# Patient Record
Sex: Male | Born: 1990 | Race: White | Hispanic: No | Marital: Single
Health system: Midwestern US, Community
[De-identification: ages and names within clinical notes are randomized; demographics above are authoritative.]

## PROBLEM LIST (undated history)

## (undated) DIAGNOSIS — B20 Human immunodeficiency virus [HIV] disease: Secondary | ICD-10-CM

## (undated) DIAGNOSIS — I10 Essential (primary) hypertension: Secondary | ICD-10-CM

## (undated) HISTORY — PX: FRACTURE SURGERY: SHX138

## (undated) HISTORY — PX: TONSILLECTOMY: SUR1361

---

## 2014-07-07 NOTE — ED Notes (Signed)
Pt states "I am suppose to get some cysts removed on the back of my head, and they are painful."

## 2014-07-08 NOTE — ED Provider Notes (Signed)
HPI Comments: 12:18 AM           Patient is a 23 y.o. male presenting with skin problem. The history is provided by the patient. No language interpreter was used.   Skin Problem        Written by Ephriam Jenkins, ED Scribe, as dictated by Osborne Oman, PA-C.     Past Medical History   Diagnosis Date   ??? Musculoskeletal disorder    ??? Compound fracture    ??? Ill-defined condition         Past Surgical History   Procedure Laterality Date   ??? Hx tonsillectomy     ??? Hx orthopaedic           History reviewed. No pertinent family history.     History     Social History   ??? Marital Status: SINGLE     Spouse Name: N/A     Number of Children: N/A   ??? Years of Education: N/A     Occupational History   ??? Not on file.     Social History Main Topics   ??? Smoking status: Never Smoker    ??? Smokeless tobacco: Not on file   ??? Alcohol Use: No   ??? Drug Use: No   ??? Sexual Activity: Not on file     Other Topics Concern   ??? Not on file     Social History Narrative   ??? No narrative on file                  ALLERGIES: Pcn      Review of Systems   Constitutional: Negative for fever and fatigue.   HENT: Negative for rhinorrhea and sore throat.    Respiratory: Negative for cough and shortness of breath.    Cardiovascular: Negative for chest pain and palpitations.   Gastrointestinal: Negative for nausea, vomiting, abdominal pain and diarrhea.   Genitourinary: Negative for dysuria and difficulty urinating.   Musculoskeletal: Negative for myalgias and arthralgias.   Skin: Negative for color change and rash.   Neurological: Negative for light-headedness and headaches.   All other systems reviewed and are negative.      Filed Vitals:    07/07/14 2137   BP: 157/84   Pulse: 83   Temp: 97.9 ??F (36.6 ??C)   Resp: 18   Height: 5\' 10"  (1.778 m)   Weight: 89.812 kg (198 lb)   SpO2: 98%            Physical Exam   Nursing note and vitals reviewed.     RESULTS:    No orders to display        Labs Reviewed - No data to display     No results found for this or any previous visit (from the past 12 hour(s)).    MDM  Number of Diagnoses or Management Options  Patient left without being seen:   Diagnosis management comments: Left without being seen    MEDICATIONS GIVEN:  Medications - No data to display   Procedures    PROGRESS NOTE:  12:18 AM  Initial assessment performed.  Recorded by Ephriam Jenkins, ED Scribe, as dictated by Osborne Oman, PA-C.    Discharge Note:  Henry James's results have been reviewed with him and/or his family. He has been counseled regarding his diagnosis, treatment, and plan. He verbally conveys understanding and agreement of the signs, symptoms, diagnosis, treatment and prognosis and additionally agrees to follow up as discussed.  He also agrees with the care-plan and conveys that all of his questions have been answered. I have also provided discharge instructions for him that include: educational information regarding the diagnosis and treatment, and a list of reasons why He would want to return to the ED prior to his follow-up appointment, should his condition change.    CLINICAL IMPRESSION    1. Patient left without being seen        After visit plan:  D/c home    There are no discharge medications for this patient.      Follow-up Information    None          Recorded by Ephriam Jenkins, ED Scribe, as dictated by Osborne Oman, PA-C.    I agree with the above documentation as written by the scribe.  Osborne Oman, PA-C.

## 2014-09-29 ENCOUNTER — Emergency Department: Admit: 2014-09-30 | Payer: TRICARE (CHAMPUS) | Primary: Student in an Organized Health Care Education/Training Program

## 2014-09-29 ENCOUNTER — Inpatient Hospital Stay
Admit: 2014-09-29 | Discharge: 2014-09-30 | Disposition: A | Discharge status: Home / Self Care | Payer: TRICARE (CHAMPUS) | Attending: Emergency Medicine

## 2014-09-29 DIAGNOSIS — R51 Headache: Secondary | ICD-10-CM

## 2014-09-29 MED ORDER — SODIUM CHLORIDE 0.9% BOLUS IV
0.9 % | Freq: Once | INTRAVENOUS | Status: AC
Start: 2014-09-29 — End: 2014-09-29
  Administered 2014-09-30: via INTRAVENOUS

## 2014-09-29 MED ORDER — KETOROLAC TROMETHAMINE 30 MG/ML INJECTION
30 mg/mL (1 mL) | INTRAMUSCULAR | Status: AC
Start: 2014-09-29 — End: 2014-09-29
  Administered 2014-09-30: via INTRAVENOUS

## 2014-09-29 MED ORDER — PROMETHAZINE 25 MG/ML INJECTION
25 mg/mL | INTRAMUSCULAR | Status: AC
Start: 2014-09-29 — End: 2014-09-29
  Administered 2014-09-30: via INTRAVENOUS

## 2014-09-29 MED FILL — KETOROLAC TROMETHAMINE 30 MG/ML INJECTION: 30 mg/mL (1 mL) | INTRAMUSCULAR | Qty: 1

## 2014-09-29 MED FILL — SODIUM CHLORIDE 0.9 % IV: INTRAVENOUS | Qty: 1000

## 2014-09-29 MED FILL — PROMETHAZINE 25 MG/ML INJECTION: 25 mg/mL | INTRAMUSCULAR | Qty: 0.5

## 2014-09-29 NOTE — ED Notes (Signed)
"  I have been having headache but today it was worse and I got dizzy and I fell to my knee and my right knee hurts. " Patient denies LOC

## 2014-09-29 NOTE — ED Notes (Signed)
Patient armband removed and shredded I have reviewed discharge instructions with the patient.  The patient verbalized understanding.  Taken home by friend at bedside.   1 prescription and work note given.

## 2014-09-29 NOTE — ED Provider Notes (Signed)
HPI Comments:   7:40 PM  23 y.o. male presents to ED C/O constant HA onset 5 months ago. Associated symptoms include nausea. Pt was seen at [facility], had a biopsy performed on a cyst on the right side of his head, had CT scans performed, and rx'd pain medications. Pt reports the HAs have been worsening lately and was walking today, became dizzy, and suddenyl fell to the ground. Pt has not yet seen a neurologist. Pt was recently dx'd with HIV (76month ago, taking meds & has pcp following his care). Patient denies fever, vomiting, difficulty sleeping, CP, SOB, depression, or any other symptoms or complaints.     Patient is a 23y.o. male presenting with headaches. The history is provided by the patient. No language interpreter was used.   Headache   This is a recurrent problem. The current episode started more than 1 week ago. The problem has been gradually worsening. The headache is aggravated by nausea. Associated symptoms include nausea. Pertinent negatives include no fever, no palpitations, no shortness of breath and no vomiting.      Written by KStormy Card ED Scribe, as dictated by BClement Husbands PA-C     Past Medical History   Diagnosis Date   ??? Musculoskeletal disorder    ??? Compound fracture    ??? Ill-defined condition    ??? HIV (human immunodeficiency virus infection) ([facility]         Past Surgical History   Procedure Laterality Date   ??? Hx tonsillectomy     ??? Hx orthopaedic           History reviewed. No pertinent family history.     History     Social History   ??? Marital Status: SINGLE     Spouse Name: N/A     Number of Children: N/A   ??? Years of Education: N/A     Occupational History   ??? Not on file.     Social History Main Topics   ??? Smoking status: Never Smoker    ??? Smokeless tobacco: Not on file   ??? Alcohol Use: No   ??? Drug Use: No   ??? Sexual Activity: Not on file     Other Topics Concern   ??? Not on file     Social History Narrative                  ALLERGIES: Pcn       Review of Systems   Constitutional: Negative for fever and fatigue.   HENT: Negative for rhinorrhea and sore throat.    Respiratory: Negative for cough and shortness of breath.    Cardiovascular: Negative for chest pain and palpitations.   Gastrointestinal: Positive for nausea. Negative for vomiting, abdominal pain and diarrhea.   Genitourinary: Negative for dysuria and difficulty urinating.   Musculoskeletal: Negative for myalgias and arthralgias.   Skin: Negative for color change and rash.   Neurological: Positive for headaches. Negative for light-headedness.   All other systems reviewed and are negative.      Filed Vitals:    09/29/14 1830   BP: 155/91   Pulse: 84   Temp: 98 ??F (36.7 ??C)   Resp: 20   Height: '5\' 10"'  (1.778 m)   Weight: 90.719 kg (200 lb)   SpO2: 100%            Physical Exam   Constitutional: He is oriented to person, place, and time. He appears well-developed and  well-nourished.  Non-toxic appearance. He does not have a sickly appearance. He does not appear ill. No distress.   texting on phone when provider entered the room, appears comfortable & in NAD, smiling during H&P   HENT:   Head: Normocephalic and atraumatic.   Right Ear: External ear normal.   Left Ear: External ear normal.   Nose: Nose normal.   Mouth/Throat: Oropharynx is clear and moist.   Eyes: Conjunctivae and EOM are normal. Pupils are equal, round, and reactive to light.   Neck: Normal range of motion. Neck supple.   No meningeal signs   Cardiovascular: Normal rate and regular rhythm.    Pulmonary/Chest: Effort normal and breath sounds normal.   Musculoskeletal: Normal range of motion.   Neurological: He is alert and oriented to person, place, and time. No cranial nerve deficit. Coordination normal.   Skin: Skin is warm and dry.   Psychiatric: He has a normal mood and affect. His behavior is normal.   Nursing note and vitals reviewed.       RESULTS:    EKG interpretation: (Preliminary)  6:43 PM    Normal sinus rhythm with sinus arrhythmia  Normal ECG  ST elevation probably second degree  Early repolarization  Vent. Rate 78 bpm  EKG read by Edwena Bunde, MD    X-RAY FINDINGS:  8:59 PM  Right Knee X-ray shows NAP.  Pending review by Radiologist  Recorded by Salley Slaughter, ED Scribe, as dictated by Clement Husbands, PA-C     XR KNEE RT MIN 4 V   Preliminary Result      CT HEAD WO CONT   Final Result   IMPRESSION:  No acute intracranial abnormalities.        Labs Reviewed   CBC WITH AUTOMATED DIFF - Abnormal; Notable for the following:     MONOCYTES 11 (*)     All other components within normal limits   METABOLIC PANEL, COMPREHENSIVE - Abnormal; Notable for the following:     BUN/Creatinine ratio 8 (*)     All other components within normal limits       Recent Results (from the past 12 hour(s))   EKG, 12 LEAD, INITIAL    Collection Time: 09/29/14  6:43 PM   Result Value Ref Range    Ventricular Rate 78 BPM    Atrial Rate 78 BPM    P-R Interval 144 ms    QRS Duration 88 ms    Q-T Interval 350 ms    QTC Calculation (Bezet) 399 ms    Calculated P Axis 40 degrees    Calculated R Axis 18 degrees    Calculated T Axis 20 degrees    Diagnosis       Normal sinus rhythm with sinus arrhythmia  Normal ECG  No previous ECGs available     CBC WITH AUTOMATED DIFF    Collection Time: 09/29/14  7:52 PM   Result Value Ref Range    WBC 6.5 4.6 - 13.2 K/uL    RBC 5.29 4.70 - 5.50 M/uL    HGB 15.9 13.0 - 16.0 g/dL    HCT 45.2 36.0 - 48.0 %    MCV 85.4 74.0 - 97.0 FL    MCH 30.1 24.0 - 34.0 PG    MCHC 35.2 31.0 - 37.0 g/dL    RDW 13.7 11.6 - 14.5 %    PLATELET 219 135 - 420 K/uL    MPV 10.2 9.2 - 11.8 FL  NEUTROPHILS 50 40 - 73 %    LYMPHOCYTES 37 21 - 52 %    MONOCYTES 11 (H) 3 - 10 %    EOSINOPHILS 2 0 - 5 %    BASOPHILS 0 0 - 2 %    ABS. NEUTROPHILS 3.2 1.8 - 8.0 K/UL    ABS. LYMPHOCYTES 2.4 0.9 - 3.6 K/UL    ABS. MONOCYTES 0.7 0.05 - 1.2 K/UL    ABS. EOSINOPHILS 0.1 0.0 - 0.4 K/UL    ABS. BASOPHILS 0.0 0.0 - 0.06 K/UL     DF AUTOMATED     METABOLIC PANEL, COMPREHENSIVE    Collection Time: 09/29/14  7:52 PM   Result Value Ref Range    Sodium 145 136 - 145 mmol/L    Potassium 3.8 3.5 - 5.5 mmol/L    Chloride 107 100 - 108 mmol/L    CO2 29 21 - 32 mmol/L    Anion gap 9 3.0 - 18 mmol/L    Glucose 97 74 - 99 mg/dL    BUN 10 7.0 - 18 MG/DL    Creatinine 1.29 0.6 - 1.3 MG/DL    BUN/Creatinine ratio 8 (L) 12 - 20      GFR est AA >60 >60 ml/min/1.79m    GFR est non-AA >60 >60 ml/min/1.772m   Calcium 8.9 8.5 - 10.1 MG/DL    Bilirubin, total 0.6 0.2 - 1.0 MG/DL    ALT 25 16 - 61 U/L    AST 19 15 - 37 U/L    Alk. phosphatase 90 45 - 117 U/L    Protein, total 7.5 6.4 - 8.2 g/dL    Albumin 4.0 3.4 - 5.0 g/dL    Globulin 3.5 2.0 - 4.0 g/dL    A-G Ratio 1.1 0.8 - 1.7          MDM  Number of Diagnoses or Management Options  Headache, unspecified headache type:   Knee contusion, right, initial encounter:      Amount and/or Complexity of Data Reviewed  Clinical lab tests: ordered and reviewed  Tests in the radiology section of CPT??: ordered and reviewed (Right Knee X-ray and CT Head)  Tests in the medicine section of CPT??: ordered and reviewed (EKG)  Independent visualization of images, tracings, or specimens: yes (EKG and Right Knee X-ray)        MEDICATIONS GIVEN:  Medications   ketorolac (TORADOL) injection 30 mg (30 mg IntraVENous Given 09/29/14 2000)   promethazine (PHENERGAN) 12.5 mg in 0.9% sodium chloride 50 mL IVPB (0 mg IntraVENous IV Completed 09/29/14 2024)   sodium chloride 0.9 % bolus infusion 1,000 mL (1,000 mL IntraVENous New Bag 09/29/14 2000)        Procedures    PROGRESS NOTE:  7:40 PM  Initial assessment performed.  Written by KrStormy CardED Scribe, as dictated by BrClement HusbandsPA-C    DISCHARGE NOTE:  9:19 PM   Jarryn Mroz's  results have been reviewed with him.  He has been counseled regarding his diagnosis, treatment, and plan.  He verbally conveys understanding and agreement of the signs, symptoms, diagnosis,  treatment and prognosis and additionally agrees to follow up as discussed.  He also agrees with the care-plan and conveys that all of his questions have been answered.  I have also provided discharge instructions for him that include: educational information regarding their diagnosis and treatment, and list of reasons why they would want to return to the ED prior to their follow-up appointment, should  his condition change.    CLINICAL IMPRESSION:    1. Headache, unspecified headache type    2. Knee contusion, right, initial encounter        PLAN: DISCHARGE HOME    Follow-up Information     Follow up With Details Comments [place] Call in 2 days Follow up with your PCP. [phone]    [facility] EMERGENCY DEPT  As needed, If symptoms worsen [address]  [place] [address]  [phone]          Current Discharge Medication List      START taking these medications    Details   butalbital-acetaminophen-caffeine (FIORICET) 50-325-40 mg per tablet Take 1 Tab by mouth every four (4) hours as needed for Headache. Max Daily Amount: 6 Tabs.  Qty: 15 Tab, Refills: 0         CONTINUE these medications which have NOT CHANGED    Details   abacavir-dolutegravir-lamiVUDine (TRIUMEQ) tablet Take  by mouth daily.             Written by Stormy Card, ED Scribe, as dictated by Clement Husbands, PA-C.       I agree with the above documentation as written by the scribe.  Clement Husbands, PA-C

## 2014-09-29 NOTE — ED Notes (Signed)
Patient goes to xray.

## 2014-09-30 LAB — METABOLIC PANEL, COMPREHENSIVE
A-G Ratio: 1.1 (ref 0.8–1.7)
ALT (SGPT): 25 U/L (ref 16–61)
AST (SGOT): 19 U/L (ref 15–37)
Albumin: 4 g/dL (ref 3.4–5.0)
Alk. phosphatase: 90 U/L (ref 45–117)
Anion gap: 9 mmol/L (ref 3.0–18)
BUN/Creatinine ratio: 8 — ABNORMAL LOW (ref 12–20)
BUN: 10 MG/DL (ref 7.0–18)
Bilirubin, total: 0.6 MG/DL (ref 0.2–1.0)
CO2: 29 mmol/L (ref 21–32)
Calcium: 8.9 MG/DL (ref 8.5–10.1)
Chloride: 107 mmol/L (ref 100–108)
Creatinine: 1.29 MG/DL (ref 0.6–1.3)
GFR est AA: >60 mL/min/{1.73_m2} (ref >60)
GFR est non-AA: >60 mL/min/{1.73_m2} (ref >60)
Globulin: 3.5 g/dL (ref 2.0–4.0)
Glucose: 97 mg/dL (ref 74–99)
Potassium: 3.8 mmol/L (ref 3.5–5.5)
Protein, total: 7.5 g/dL (ref 6.4–8.2)
Sodium: 145 mmol/L (ref 136–145)

## 2014-09-30 LAB — CBC WITH AUTOMATED DIFF
ABS. BASOPHILS: 0 10*3/uL (ref 0.0–0.06)
ABS. EOSINOPHILS: 0.1 10*3/uL (ref 0.0–0.4)
ABS. LYMPHOCYTES: 2.4 10*3/uL (ref 0.9–3.6)
ABS. MONOCYTES: 0.7 10*3/uL (ref 0.05–1.2)
ABS. NEUTROPHILS: 3.2 10*3/uL (ref 1.8–8.0)
BASOPHILS: 0 % (ref 0–2)
EOSINOPHILS: 2 % (ref 0–5)
HCT: 45.2 % (ref 36.0–48.0)
HGB: 15.9 g/dL (ref 13.0–16.0)
LYMPHOCYTES: 37 % (ref 21–52)
MCH: 30.1 PG (ref 24.0–34.0)
MCHC: 35.2 g/dL (ref 31.0–37.0)
MCV: 85.4 FL (ref 74.0–97.0)
MONOCYTES: 11 % — ABNORMAL HIGH (ref 3–10)
MPV: 10.2 FL (ref 9.2–11.8)
NEUTROPHILS: 50 % (ref 40–73)
PLATELET: 219 10*3/uL (ref 135–420)
RBC: 5.29 M/uL (ref 4.70–5.50)
RDW: 13.7 % (ref 11.6–14.5)
WBC: 6.5 10*3/uL (ref 4.6–13.2)

## 2014-09-30 MED ORDER — BUTALBITAL-ACETAMINOPHEN-CAFFEINE 50 MG-325 MG-40 MG TAB
50-325-40 mg | ORAL_TABLET | ORAL | Status: DC | PRN
Start: 2014-09-30 — End: 2014-11-16

## 2014-10-01 LAB — EKG, 12 LEAD, INITIAL
Atrial Rate: 78 {beats}/min
Calculated P Axis: 40 degrees
Calculated R Axis: 18 degrees
Calculated T Axis: 20 degrees
Diagnosis: NORMAL
P-R Interval: 144 ms
Q-T Interval: 350 ms
QRS Duration: 88 ms
QTC Calculation (Bezet): 399 ms
Ventricular Rate: 78 {beats}/min

## 2014-11-16 ENCOUNTER — Inpatient Hospital Stay
Admit: 2014-11-16 | Discharge: 2014-11-16 | Disposition: A | Discharge status: Home / Self Care | Payer: TRICARE (CHAMPUS) | Attending: Emergency Medicine

## 2014-11-16 ENCOUNTER — Emergency Department: Admit: 2014-11-16 | Payer: TRICARE (CHAMPUS) | Primary: Student in an Organized Health Care Education/Training Program

## 2014-11-16 DIAGNOSIS — J209 Acute bronchitis, unspecified: Secondary | ICD-10-CM

## 2014-11-16 LAB — INFLUENZA A & B AG (RAPID TEST)
Influenza A Antigen: NEGATIVE
Influenza B Antigen: NEGATIVE

## 2014-11-16 MED ORDER — AZITHROMYCIN 250 MG TAB
250 mg | ORAL | Status: AC
Start: 2014-11-16 — End: 2014-11-21

## 2014-11-16 NOTE — ED Notes (Signed)
Pt discharged home stable and ambulatory.   Pain level at discharge unchanged.  Pt discharged with self.  Reviewed discharged instructions with patient who verbalized understanding.  Patient armband removed and shredded

## 2014-11-16 NOTE — ED Notes (Signed)
Pt reports first time he has been sick since discovering he is HIV positive. Pt reports Wednesday he began to have a sore throat and then began to experience nasal congestion and body aches.
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
## 2015-08-13 ENCOUNTER — Emergency Department (HOSPITAL_COMMUNITY)
Admission: EM | Admit: 2015-08-13 | Discharge: 2015-08-13 | Disposition: A | Discharge status: Home / Self Care | Payer: No Typology Code available for payment source | Attending: Emergency Medicine | Admitting: Emergency Medicine

## 2015-08-13 ENCOUNTER — Encounter (HOSPITAL_COMMUNITY): Payer: Self-pay | Admitting: Emergency Medicine

## 2015-08-13 ENCOUNTER — Emergency Department (HOSPITAL_COMMUNITY): Payer: No Typology Code available for payment source

## 2015-08-13 DIAGNOSIS — Z79899 Other long term (current) drug therapy: Secondary | ICD-10-CM | POA: Diagnosis not present

## 2015-08-13 DIAGNOSIS — S0003XA Contusion of scalp, initial encounter: Secondary | ICD-10-CM | POA: Insufficient documentation

## 2015-08-13 DIAGNOSIS — T148 Other injury of unspecified body region: Secondary | ICD-10-CM | POA: Diagnosis not present

## 2015-08-13 DIAGNOSIS — T07XXXA Unspecified multiple injuries, initial encounter: Secondary | ICD-10-CM

## 2015-08-13 DIAGNOSIS — Z7951 Long term (current) use of inhaled steroids: Secondary | ICD-10-CM | POA: Diagnosis not present

## 2015-08-13 DIAGNOSIS — Z880 Allergy status to penicillin: Secondary | ICD-10-CM | POA: Diagnosis not present

## 2015-08-13 DIAGNOSIS — S0990XA Unspecified injury of head, initial encounter: Secondary | ICD-10-CM | POA: Diagnosis present

## 2015-08-13 DIAGNOSIS — Y998 Other external cause status: Secondary | ICD-10-CM | POA: Diagnosis not present

## 2015-08-13 DIAGNOSIS — Y9389 Activity, other specified: Secondary | ICD-10-CM | POA: Diagnosis not present

## 2015-08-13 DIAGNOSIS — Y92410 Unspecified street and highway as the place of occurrence of the external cause: Secondary | ICD-10-CM | POA: Insufficient documentation

## 2015-08-13 DIAGNOSIS — I10 Essential (primary) hypertension: Secondary | ICD-10-CM | POA: Insufficient documentation

## 2015-08-13 DIAGNOSIS — R52 Pain, unspecified: Secondary | ICD-10-CM

## 2015-08-13 HISTORY — DX: Essential (primary) hypertension: I10

## 2015-08-13 MED ORDER — IBUPROFEN 800 MG PO TABS
800.0000 mg | ORAL_TABLET | Freq: Once | ORAL | Status: AC
  Administered 2015-08-13: 800 mg via ORAL
  Filled 2015-08-13: qty 1

## 2015-08-13 MED ORDER — IBUPROFEN 600 MG PO TABS: 600.0000 mg | ORAL_TABLET | Freq: Four times a day (QID) | ORAL | Status: DC | PRN

## 2015-08-13 NOTE — ED Notes (Signed)
Pt cussing and yelling in the hall, wanting his friend to come back. Pt asked not to be cussing. Pt continued to yell and stated he was about to go off up in here. I let the pt know there would not be any visitors allowed until he was able to calm down.
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
## 2015-08-13 NOTE — ED Notes (Signed)
Pt walked up to triage to see another pt

## 2015-08-13 NOTE — ED Notes (Signed)
This Consulting civil engineer was asked to speak w/ Pt, due to him becoming belligerent upon arrival.  This Charge RN heard the Pt yelling and cussing while walking up to the Nurses' station.  Per staff, the Pt was demanding a visitor upon arrival and there was a misunderstanding regarding if the visitor was a patient too.  Pt became very upset when staff attempted to clarify.  Security and this Consulting civil engineer were called to bedside.  This Charge RN was able to calm the Pt down, explain the misunderstanding, and explain the delay in his care.  Pt was informed that if he could stay calm and cooperative, we would allow a visitor when he/she arrived.  It was clarified that the visitor was actually visting another patient in Triage.

## 2015-08-13 NOTE — ED Notes (Signed)
Patient lying on stretcher; talking on phone.  Patient asking how someone gets from triage to where he is in hallway.  I stated to patient that if someone was waiting to be seen, that it would be best for that person to stay in triage.  The patient began yelling and cursing in the hallway.  I called security and Alaina, RN spoke with patient stating that he needed to calm down and then he could have a visitor.
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
## 2015-08-13 NOTE — Discharge Instructions (Signed)
Hematoma °A hematoma is a collection of blood under the skin, in an organ, in a body space, in a joint space, or in other tissue. The blood can clot to form a lump that you can see and feel. The lump is often firm and may sometimes become sore and tender. Most hematomas get better in a few days to weeks. However, some hematomas may be serious and require medical care. Hematomas can range in size from very small to very large. °CAUSES  °A hematoma can be caused by a blunt or penetrating injury. It can also be caused by spontaneous leakage from a blood vessel under the skin. Spontaneous leakage from a blood vessel is more likely to occur in older people, especially those taking blood thinners. Sometimes, a hematoma can develop after certain medical procedures. °SIGNS AND SYMPTOMS  °· A firm lump on the body. °· Possible pain and tenderness in the area. °· Bruising. Blue, dark blue, purple-red, or yellowish skin may appear at the site of the hematoma if the hematoma is close to the surface of the skin. °For hematomas in deeper tissues or body spaces, the signs and symptoms may be subtle. For example, an intra-abdominal hematoma may cause abdominal pain, weakness, fainting, and shortness of breath. An intracranial hematoma may cause a headache or symptoms such as weakness, trouble speaking, or a change in consciousness. °DIAGNOSIS  °A hematoma can usually be diagnosed based on your medical history and a physical exam. Imaging tests may be needed if your health care provider suspects a hematoma in deeper tissues or body spaces, such as the abdomen, head, or chest. These tests may include ultrasonography or a CT scan.  °TREATMENT  °Hematomas usually go away on their own over time. Rarely does the blood need to be drained out of the body. Large hematomas or those that may affect vital organs will sometimes need surgical drainage or monitoring. °HOME CARE INSTRUCTIONS  °· Apply ice to the injured area:   °¨ Put ice in a  plastic bag.   °¨ Place a towel between your skin and the bag.   °¨ Leave the ice on for 20 minutes, 2-3 times a day for the first 1 to 2 days.   °· After the first 2 days, switch to using warm compresses on the hematoma.   °· Elevate the injured area to help decrease pain and swelling. Wrapping the area with an elastic bandage may also be helpful. Compression helps to reduce swelling and promotes shrinking of the hematoma. Make sure the bandage is not wrapped too tight.   °· If your hematoma is on a lower extremity and is painful, crutches may be helpful for a couple days.   °· Only take over-the-counter or prescription medicines as directed by your health care provider. °SEEK IMMEDIATE MEDICAL CARE IF:  °· You have increasing pain, or your pain is not controlled with medicine.   °· You have a fever.   °· You have worsening swelling or discoloration.   °· Your skin over the hematoma breaks or starts bleeding.   °· Your hematoma is in your chest or abdomen and you have weakness, shortness of breath, or a change in consciousness. °· Your hematoma is on your scalp (caused by a fall or injury) and you have a worsening headache or a change in alertness or consciousness. °MAKE SURE YOU:  °· Understand these instructions. °· Will watch your condition. °· Will get help right away if you are not doing well or get worse. °Document Released: 07/11/2004 Document Revised: 07/30/2013 Document Reviewed: 05/07/2013 °  ExitCare® Patient Information ©2015 [employer]. This information is not intended to replace advice given to you by your health care provider. Make sure you discuss any questions you have with your health care provider. ° °Contusion °A contusion is a deep bruise. Contusions are the result of an injury that caused bleeding under the skin. The contusion may turn blue, purple, or yellow. Minor injuries will give you a painless contusion, but more severe contusions may stay painful and swollen for a few weeks.  °CAUSES    °A contusion is usually caused by a blow, trauma, or direct force to an area of the body. °SYMPTOMS  °· Swelling and redness of the injured area. °· Bruising of the injured area. °· Tenderness and soreness of the injured area. °· Pain. °DIAGNOSIS  °The diagnosis can be made by taking a history and physical exam. An X-ray, CT scan, or MRI may be needed to determine if there were any associated injuries, such as fractures. °TREATMENT  °Specific treatment will depend on what area of the body was injured. In general, the best treatment for a contusion is resting, icing, elevating, and applying cold compresses to the injured area. Over-the-counter medicines may also be recommended for pain control. Ask your caregiver what the best treatment is for your contusion. °HOME CARE INSTRUCTIONS  °· Put ice on the injured area. °¨ Put ice in a plastic bag. °¨ Place a towel between your skin and the bag. °¨ Leave the ice on for 15-20 minutes, 3-4 times a day, or as directed by your health care provider. °· Only take over-the-counter or prescription medicines for pain, discomfort, or fever as directed by your caregiver. Your caregiver may recommend avoiding anti-inflammatory medicines (aspirin, ibuprofen, and naproxen) for 48 hours because these medicines may increase bruising. °· Rest the injured area. °· If possible, elevate the injured area to reduce swelling. °SEEK IMMEDIATE MEDICAL CARE IF:  °· You have increased bruising or swelling. °· You have pain that is getting worse. °· Your swelling or pain is not relieved with medicines. °MAKE SURE YOU:  °· Understand these instructions. °· Will watch your condition. °· Will get help right away if you are not doing well or get worse. °Document Released: 09/06/2005 Document Revised: 12/02/2013 Document Reviewed: 10/02/2011 °ExitCare® Patient Information ©2015 [employer]. This information is not intended to replace advice given to you by your health care provider. Make sure you  discuss any questions you have with your health care provider. ° °
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
## 2015-11-29 ENCOUNTER — Encounter (HOSPITAL_COMMUNITY): Payer: Self-pay | Admitting: Emergency Medicine

## 2015-11-29 ENCOUNTER — Emergency Department (HOSPITAL_COMMUNITY)
Admission: EM | Admit: 2015-11-29 | Discharge: 2015-11-29 | Discharge status: Against Medical Advice | Payer: Non-veteran care | Attending: Emergency Medicine | Admitting: Emergency Medicine

## 2015-11-29 ENCOUNTER — Emergency Department (HOSPITAL_COMMUNITY): Payer: Non-veteran care

## 2015-11-29 DIAGNOSIS — R600 Localized edema: Secondary | ICD-10-CM | POA: Insufficient documentation

## 2015-11-29 DIAGNOSIS — Z7951 Long term (current) use of inhaled steroids: Secondary | ICD-10-CM | POA: Diagnosis not present

## 2015-11-29 DIAGNOSIS — R51 Headache: Secondary | ICD-10-CM | POA: Diagnosis not present

## 2015-11-29 DIAGNOSIS — Z79899 Other long term (current) drug therapy: Secondary | ICD-10-CM | POA: Diagnosis not present

## 2015-11-29 DIAGNOSIS — R202 Paresthesia of skin: Secondary | ICD-10-CM | POA: Diagnosis not present

## 2015-11-29 DIAGNOSIS — Z880 Allergy status to penicillin: Secondary | ICD-10-CM | POA: Diagnosis not present

## 2015-11-29 DIAGNOSIS — Z21 Asymptomatic human immunodeficiency virus [HIV] infection status: Secondary | ICD-10-CM | POA: Insufficient documentation

## 2015-11-29 DIAGNOSIS — I10 Essential (primary) hypertension: Secondary | ICD-10-CM | POA: Insufficient documentation

## 2015-11-29 DIAGNOSIS — M25571 Pain in right ankle and joints of right foot: Secondary | ICD-10-CM

## 2015-11-29 DIAGNOSIS — R519 Headache, unspecified: Secondary | ICD-10-CM

## 2015-11-29 DIAGNOSIS — R42 Dizziness and giddiness: Secondary | ICD-10-CM | POA: Insufficient documentation

## 2015-11-29 DIAGNOSIS — R200 Anesthesia of skin: Secondary | ICD-10-CM | POA: Insufficient documentation

## 2015-11-29 HISTORY — DX: Human immunodeficiency virus (HIV) disease: B20

## 2015-11-29 LAB — BASIC METABOLIC PANEL
ANION GAP: 6 (ref 5–15)
BUN: 11 mg/dL (ref 6–20)
CALCIUM: 9.1 mg/dL (ref 8.9–10.3)
CO2: 29 mmol/L (ref 22–32)
Chloride: 102 mmol/L (ref 101–111)
Creatinine, Ser: 1.25 mg/dL — ABNORMAL HIGH (ref 0.61–1.24)
GLUCOSE: 97 mg/dL (ref 65–99)
Potassium: 4.3 mmol/L (ref 3.5–5.1)
SODIUM: 137 mmol/L (ref 135–145)

## 2015-11-29 LAB — CBC WITH DIFFERENTIAL/PLATELET
Basophils Absolute: 0 10*3/uL (ref 0.0–0.1)
Basophils Relative: 0 %
EOS PCT: 3 %
Eosinophils Absolute: 0.2 10*3/uL (ref 0.0–0.7)
HEMATOCRIT: 48.8 % (ref 39.0–52.0)
HEMOGLOBIN: 16.6 g/dL (ref 13.0–17.0)
LYMPHS ABS: 2.3 10*3/uL (ref 0.7–4.0)
LYMPHS PCT: 36 %
MCH: 29.6 pg (ref 26.0–34.0)
MCHC: 34 g/dL (ref 30.0–36.0)
MCV: 87.1 fL (ref 78.0–100.0)
MONOS PCT: 11 %
Monocytes Absolute: 0.7 10*3/uL (ref 0.1–1.0)
NEUTROS PCT: 50 %
Neutro Abs: 3.2 10*3/uL (ref 1.7–7.7)
Platelets: 237 10*3/uL (ref 150–400)
RBC: 5.6 MIL/uL (ref 4.22–5.81)
RDW: 12.6 % (ref 11.5–15.5)
WBC: 6.3 10*3/uL (ref 4.0–10.5)

## 2015-11-29 MED ORDER — SODIUM CHLORIDE 0.9 % IV BOLUS (SEPSIS)
1000.0000 mL | Freq: Once | INTRAVENOUS | Status: AC
Start: 2015-11-29 — End: 2015-11-29
  Administered 2015-11-29: 1000 mL via INTRAVENOUS

## 2015-11-29 MED ORDER — KETOROLAC TROMETHAMINE 30 MG/ML IJ SOLN
30.0000 mg | Freq: Once | INTRAMUSCULAR | Status: AC
  Administered 2015-11-29: 30 mg via INTRAVENOUS
  Filled 2015-11-29: qty 1

## 2015-11-29 MED ORDER — METOCLOPRAMIDE HCL 5 MG/ML IJ SOLN
10.0000 mg | Freq: Once | INTRAMUSCULAR | Status: AC
  Administered 2015-11-29: 10 mg via INTRAVENOUS
  Filled 2015-11-29: qty 2

## 2015-11-29 MED ORDER — DIPHENHYDRAMINE HCL 50 MG/ML IJ SOLN
12.5000 mg | Freq: Once | INTRAMUSCULAR | Status: AC
  Administered 2015-11-29: 12.5 mg via INTRAVENOUS
  Filled 2015-11-29: qty 1

## 2015-11-29 MED ORDER — LISINOPRIL 20 MG PO TABS: 20.0000 mg | ORAL_TABLET | Freq: Every day | ORAL | Status: AC

## 2015-11-29 NOTE — ED Notes (Signed)
PA at bedside.

## 2015-11-29 NOTE — ED Notes (Signed)
MD at bedside. 

## 2015-11-29 NOTE — ED Provider Notes (Signed)
CSN: [id number]     Arrival date & time 11/29/15  1626 History  By signing my name below, I, [facility] Center[facility] Washington, attest that this documentation has been prepared under the direction and in the presence of Glean HessElizabeth Westfall, [address]. Electronically Signed: Randell PatientMarrissa Washington, ED Scribe. 11/29/2015. 6:42 PM.   Chief Complaint  Patient presents with  . Headache  . Foot Pain    The history is provided by the patient. No language interpreter was used.    HPI Comments: Danny Camacho is a 24 y.o. male with an hx of HTN, HIV, and migraines who presents to the Emergency Department complaining of mild, intermittent, right ankle pain onset 1 week ago. Patient endorses associated swelling, tingling, and numbness from the right ankle to the right toes. Pain is worsened by bearing weight. He has taken bayer with no relief. Patient denies weakness.  Patient also complains of moderate HA that presents 4-5 times a week onset 3 months ago. He endorses associated lightheadedness. Per patient, he has been out of his HTN medication, lisinopril, for 2 months. Patient [place] and he has been unable to return for a medication refill. Pain is unchanged by OTC medications. He denies dizziness and neck pain.  Patient also notes that he ran out of his HIV medication 4 weeks ago. He is unable to recall his last viral or CD4 count, but notes that [facility] months ago prior to being discharged from [facility] [employer].     Past Medical History  Diagnosis Date  . Hypertension   . HIV (human immunodeficiency virus infection) (HCC)    History reviewed. No pertinent past surgical history. No family history on file. Social History  Substance Use Topics  . Smoking status: Never Smoker   . Smokeless tobacco: None  . Alcohol Use: Yes    Review of Systems  Constitutional: Negative for fever and chills.  Musculoskeletal: Positive for  arthralgias (right ankle). Negative for neck pain.  Neurological: Positive for light-headedness, numbness and headaches. Negative for dizziness and weakness.       Positive for tingling.  All other systems reviewed and are negative.     Allergies  Penicillins  Home Medications   Prior to Admission medications   Medication Sig Start Date End Date Taking? Authorizing Provider  albuterol (PROVENTIL HFA;VENTOLIN HFA) 108 (90 BASE) MCG/ACT inhaler Inhale 1 puff into the lungs every 6 (six) hours as needed for wheezing or shortness of breath.    Historical Provider, MD  fexofenadine (ALLEGRA) 180 MG tablet Take 180 mg by mouth daily as needed for allergies or rhinitis.    Historical Provider, MD  fluticasone (FLONASE) 50 MCG/ACT nasal spray Place 2 sprays into both nostrils daily.    Historical Provider, MD  HYDROcodone-homatropine (HYCODAN) 5-1.5 MG/5ML syrup Take 5 mLs by mouth at bedtime as needed for cough.    Historical Provider, MD  ibuprofen (ADVIL,MOTRIN) 600 MG tablet Take 1 tablet (600 mg total) by mouth every 6 (six) hours as needed. 08/13/15   Lyndal Pulleyaniel Knott, MD  lisinopril (PRINIVIL,ZESTRIL) 20 MG tablet Take 1 tablet (20 mg total) by mouth daily. 11/29/15   Mady GemmaElizabeth C Westfall, PA-C    BP 174/99 mmHg  Pulse 109  Temp(Src) 97.8 F (36.6 C) (Oral)  Resp 18  Ht 5\' 9"  (1.753 m)  Wt 86.183 kg  BMI 28.05 kg/m2  SpO2 100% Physical Exam  Constitutional: He is oriented to  person, place, and time. He appears well-developed and well-nourished. No distress.  HENT:  Head: Normocephalic and atraumatic.  Right Ear: External ear normal.  Left Ear: External ear normal.  Nose: Nose normal.  Mouth/Throat: Uvula is midline, oropharynx is clear and moist and mucous membranes are normal.  Eyes: Conjunctivae, EOM and lids are normal. Pupils are equal, round, and reactive to light. Right eye exhibits no discharge. Left eye exhibits no discharge. No scleral icterus.  Neck: Normal range of  motion. Neck supple.  Cardiovascular: Normal rate, regular rhythm, normal heart sounds, intact distal pulses and normal pulses.   Pulmonary/Chest: Effort normal and breath sounds normal. No respiratory distress. He has no wheezes. He has no rales.  Abdominal: Soft. Normal appearance and bowel sounds are normal. He exhibits no distension and no mass. There is no tenderness. There is no rigidity, no rebound and no guarding.  Musculoskeletal: Normal range of motion. He exhibits edema and tenderness.  Tenderness to palpation of right lateral ankle with mild edema. Full range of motion of right ankle. Erythema or heat. Distal pulses intact. Strength and sensation intact.  Neurological: He is alert and oriented to person, place, and time. He has normal strength. No cranial nerve deficit or sensory deficit.  Skin: Skin is warm, dry and intact. No rash noted. He is not diaphoretic. No erythema. No pallor.  Psychiatric: He has a normal mood and affect. His speech is normal and behavior is normal.  Nursing note and vitals reviewed.   ED Course  Procedures   DIAGNOSTIC STUDIES: Oxygen Saturation is 100% on RA, normal by my interpretation.    COORDINATION OF CARE: 5:11 PM Will order right ankle x-ray, labs, toradol, benadryl, and reglan. Discussed treatment plan with pt at bedside and pt agreed to plan.  Labs Review Labs Reviewed  CBC WITH DIFFERENTIAL/PLATELET  BASIC METABOLIC PANEL    Imaging Review Dg Ankle Complete Right  11/29/2015  CLINICAL DATA:  Pain and swelling for 1 week EXAM: RIGHT ANKLE - COMPLETE 3+ VIEW COMPARISON:  None. FINDINGS: Frontal, oblique and lateral views obtained. There is soft tissue swelling laterally. There is an unfused apophysis in the lateral malleolar region. A small nearby avulsion is also noted in the lateral malleolus. No other fracture. No joint effusion. The ankle mortise appears intact. IMPRESSION: Small avulsion in the lateral malleolar region with soft  tissue swelling. Ankle mortise appears intact. Electronically Signed   By: Bretta Bang III M.D.   On: 11/29/2015 18:03   I have personally reviewed and evaluated these images and lab results as part of my medical decision-making.   EKG Interpretation None      MDM   Final diagnoses:  Headache  Right ankle pain    24 year old male presents with multiple complaints. He reports right foot swelling and pain for the past week, however denies injury. He denies weakness, though reports numbness and tingling. He also notes headache, which he states has been intermittent since September. He reports associated lightheadedness. He denies vision changes, neck pain. He states he has a history of migraines and that his symptoms feel similar. He states he ran out of his antihypertensive medication as well as his HIV therapy a few months ago. He reports his last viral load and CD4 count were in March, and he is unsure what the results were.   Patient is afebrile. Mildly tachycardic. Heart regular rhythm. Lungs clear to auscultation bilaterally. Abdomen soft, nontender, nondistended. Tenderness to palpation of right lateral ankle with  mild edema. Full range of motion of right ankle. No erythema or heat. Distal pulses intact. Strength and sensation intact. Normal neuro exam with no focal deficit.  Will obtain imaging of right ankle. Patient given fluids and migraine cocktail. Given headache in the setting of HIV and not having taken his antiretroviral therapy for the past month, will obtain head CT for further evaluation of symptoms.  Imaging of right ankle remarkable for small avulsion in the lateral malleolus region with soft tissue swelling. Will apply ankle brace. On reassessment of patient, he is requesting to leave. Patient has not had his head CT and basic labs have not resulted, discussed risks of leaving with patient, however he continues to express his desire to leave AMA (states he took the car  and his husband has to get to work). Advised patient to follow up as soon as possible; he states he will follow-up at the [facility] tomorrow morning. Will refill lisinopril. Advised patient to rest, ice, elevate right ankle and take ibuprofen or tylenol for pain.  BP 174/99 mmHg  Pulse 109  Temp(Src) 97.8 F (36.6 C) (Oral)  Resp 18  Ht  (1.753 m)  Wt 86.183 kg  BMI 28.05 kg/m2  SpO2 100%  I personally performed the services described in this documentation, which was scribed in my presence. The recorded information has been reviewed and is accurate.    Mady Gemma, PA-C 11/29/15 1922  Leta Baptist, MD 12/02/15 [phone]

## 2015-11-29 NOTE — Discharge Instructions (Signed)
1. Medications: usual home medications 2. Treatment: rest, drink plenty of fluids, wear ankle brace and ice and elevate 3. Follow Up: please followup with your primary doctor for discussion of your diagnoses and further evaluation after today's visit; please follow-up with orthopedics for further evaluation of right ankle injury; if you do not have a primary care doctor use the resource guide provided to find one; please return to the ER for new or worsening symptoms   Migraine Headache A migraine headache is an intense, throbbing pain on one or both sides of your head. A migraine can last for 30 minutes to several hours. CAUSES  The exact cause of a migraine headache is not always known. However, a migraine may be caused when nerves in the brain become irritated and release chemicals that cause inflammation. This causes pain. Certain things may also trigger migraines, such as:  Alcohol.  Smoking.  Stress.  Menstruation.  Aged cheeses.  Foods or drinks that contain nitrates, glutamate, aspartame, or tyramine.  Lack of sleep.  Chocolate.  Caffeine.  Hunger.  Physical exertion.  Fatigue.  Medicines used to treat chest pain (nitroglycerine), birth control pills, estrogen, and some blood pressure medicines. SIGNS AND SYMPTOMS  Pain on one or both sides of your head.  Pulsating or throbbing pain.  Severe pain that prevents daily activities.  Pain that is aggravated by any physical activity.  Nausea, vomiting, or both.  Dizziness.  Pain with exposure to bright lights, loud noises, or activity.  General sensitivity to bright lights, loud noises, or smells. Before you get a migraine, you may get warning signs that a migraine is coming (aura). An aura may include:  Seeing flashing lights.  Seeing bright spots, halos, or zigzag lines.  Having tunnel vision or blurred vision.  Having feelings of numbness or tingling.  Having trouble talking.  Having muscle  weakness. DIAGNOSIS  A migraine headache is often diagnosed based on:  Symptoms.  Physical exam.  A CT scan or MRI of your head. These imaging tests cannot diagnose migraines, but they can help rule out other causes of headaches. TREATMENT Medicines may be given for pain and nausea. Medicines can also be given to help prevent recurrent migraines.  HOME CARE INSTRUCTIONS  Only take over-the-counter or prescription medicines for pain or discomfort as directed by your health care provider. The use of long-term narcotics is not recommended.  Lie down in a dark, quiet room when you have a migraine.  Keep a journal to find out what may trigger your migraine headaches. For example, write down:  What you eat and drink.  How much sleep you get.  Any change to your diet or medicines.  Limit alcohol consumption.  Quit smoking if you smoke.  Get 7-9 hours of sleep, or as recommended by your health care provider.  Limit stress.  Keep lights dim if bright lights bother you and make your migraines worse. SEEK IMMEDIATE MEDICAL CARE IF:   Your migraine becomes severe.  You have a fever.  You have a stiff neck.  You have vision loss.  You have muscular weakness or loss of muscle control.  You start losing your balance or have trouble walking.  You feel faint or pass out.  You have severe symptoms that are different from your first symptoms. MAKE SURE YOU:   Understand these instructions.  Will watch your condition.  Will get help right away if you are not doing well or get worse.   This information is  not intended to replace advice given to you by your health care provider. Make sure you discuss any questions you have with your health care provider.   Document Released: 11/27/2005 Document Revised: 12/18/2014 Document Reviewed: 08/04/2013 Elsevier Interactive Patient Education 2016 Elsevier Inc.  Ankle Pain Ankle pain is a common symptom. The bones, cartilage,  tendons, and muscles of the ankle joint perform a lot of work each day. The ankle joint holds your body weight and allows you to move around. Ankle pain can occur on either side or back of 1 or both ankles. Ankle pain may be sharp and burning or dull and aching. There may be tenderness, stiffness, redness, or warmth around the ankle. The pain occurs more often when a person walks or puts pressure on the ankle. CAUSES  There are many reasons ankle pain can develop. It is important to work with your caregiver to identify the cause since many conditions can impact the bones, cartilage, muscles, and tendons. Causes for ankle pain include:  Injury, including a break (fracture), sprain, or strain often due to a fall, sports, or a high-impact activity.  Swelling (inflammation) of a tendon (tendonitis).  Achilles tendon rupture.  Ankle instability after repeated sprains and strains.  Poor foot alignment.  Pressure on a nerve (tarsal tunnel syndrome).  Arthritis in the ankle or the lining of the ankle.  Crystal formation in the ankle (gout or pseudogout). DIAGNOSIS  A diagnosis is based on your medical history, your symptoms, results of your physical exam, and results of diagnostic tests. Diagnostic tests may include X-ray exams or a computerized magnetic scan (magnetic resonance imaging, MRI). TREATMENT  Treatment will depend on the cause of your ankle pain and may include:  Keeping pressure off the ankle and limiting activities.  Using crutches or other walking support (a cane or brace).  Using rest, ice, compression, and elevation.  Participating in physical therapy or home exercises.  Wearing shoe inserts or special shoes.  Losing weight.  Taking medications to reduce pain or swelling or receiving an injection.  Undergoing surgery. HOME CARE INSTRUCTIONS   Only take over-the-counter or prescription medicines for pain, discomfort, or fever as directed by your caregiver.  Put ice  on the injured area.  Put ice in a plastic bag.  Place a towel between your skin and the bag.  Leave the ice on for 15-20 minutes at a time, 03-04 times a day.  Keep your leg raised (elevated) when possible to lessen swelling.  Avoid activities that cause ankle pain.  Follow specific exercises as directed by your caregiver.  Record how often you have ankle pain, the location of the pain, and what it feels like. This information may be helpful to you and your caregiver.  Ask your caregiver about returning to work or sports and whether you should drive.  Follow up with your caregiver for further examination, therapy, or testing as directed. SEEK MEDICAL CARE IF:   Pain or swelling continues or worsens beyond 1 week.  You have an oral temperature above 102 F (38.9 C).  You are feeling unwell or have chills.  You are having an increasingly difficult time with walking.  You have loss of sensation or other new symptoms.  You have questions or concerns. MAKE SURE YOU:   Understand these instructions.  Will watch your condition.  Will get help right away if you are not doing well or get worse.   This information is not intended to replace advice given to  you by your health care provider. Make sure you discuss any questions you have with your health care provider.   Document Released: 05/17/2010 Document Revised: 02/19/2012 Document Reviewed: 06/29/2015 Elsevier Interactive Patient Education 2016 [employer].   Emergency Department Resource Guide 1) Find a Doctor and Pay Out of Pocket Although you won't have to find out who is covered by your insurance plan, it is a good idea to ask around and get recommendations. You will then need to call the office and see if the doctor you have chosen will accept you as a new patient and what types of options they offer for patients who are self-pay. Some doctors offer discounts or will set up payment plans for their patients who do not  have insurance, but you will need to ask so you aren't surprised when you get to your appointment.  2) Contact Your Local Health Department Not all health departments have doctors that can see patients for sick visits, but many do, so it is worth a call to see if yours does. If you don't know where your local health department is, you can check in your phone book. The CDC also has a tool to help you locate your state's health department, and many state websites also have listings of all of their local health departments.  3) Find a Walk-in Clinic If your illness is not likely to be very severe or complicated, you may want to try a walk in clinic. These are popping up all over the country in pharmacies, drugstores, and shopping centers. They're usually staffed by nurse practitioners or physician assistants that have been trained to treat common illnesses and complaints. They're usually fairly quick and inexpensive. However, if you have serious medical issues or chronic medical problems, these are probably not your best option.  No Primary Care Doctor: - Call Health Connect at  [phone] - they can help you locate a primary care doctor that  accepts your insurance, provides certain services, etc. - Physician Referral Service- [phone]  Chronic Pain Problems: Organization         Address  Phone   Notes  [facility]  [phone] Patients need to be referred by their primary care doctor.   Medication Assistance: Organization         Address  Phone   Notes  [facility] Medication [facility] [address] [place] [place], [place] [address] [phone] --Must be a resident of [facility] -- Must have NO insurance coverage whatsoever (no Medicaid/ Medicare, etc.) -- The pt. MUST have a primary care doctor that directs their care regularly and follows them in the community   [facility]ist  [phone]   [employer]  [phone]    Agencies that  provide inexpensive medical care: Organization         Address  Phone   Notes  [facility]  [phone]   [facility]    [phone]   [facility] [address] [place], [place] [address] [phone]   [facility] [address] [place], [place] [phone]   Planned Parenthood    [phone]   [facility]    [phone]   Community Health and [facility]  [address], [place] Phone:  [phone], Fax:  [phone] Hours of Operation:  9 am - 6 pm, M-F.  Also accepts Medicaid/Medicare and self-pay.  [facility]  [address], [place] Phone: [phone], Fax: [phone]. Hours of Operation:  8:30 am - 5:30 pm, M-F.  Also accepts Medicaid and self-pay.  [facility] [place] [address], [place] Phone: [phone](336) [phone]   [facility] [address], [place], [place] [phone], Ext. [phone] Mondays & Thursdays: 7-9 AM.  First 15 patients are seen on a first come, first serve basis.    Medicaid-accepting [facility] Providers:  Organization         Address  Phone   Notes  [facility] [address], [place] [phone]) [phone] Also accepts self-pay patients.  [facility] [address], [place]  [phone]   [facility] [address], [place] [phone]) [phone]   [facility] [address] [place] [address], [place] [phone]) [phone]   Renaye RakersVeita Bland [address], [place]   [phone] Only accepts [place] Access [place] patients after they have their name applied to their card.   Self-Pay (no insurance) in [facility] [place]:  Organization         Address  Phone   Notes  Sickle Cell Patients, [facility] [address], [place] [phone]) [phone]   [facility] [address], [place] [phone]   [facility] [place]  [address], [place] [phone]   [facility]/Dr. Osei-Bonsu  [address] [place] [address], [place] or [phone], [place] [phone] Phone number for both [place] and [place] locations is the same.  [facility]gent Medical and [facility] [address], [place] [phone](336) [phone]   [facility] [address] [place] [address], [place] or [address] [phone]) [phone]   [facility] [address], [place] [phone], phone; [phone]) [phone], fax Sees patients 1st and 3rd Saturday of every month.  Must not qualify for public or private insurance (i.e. Medicaid, Medicare, [place] Health Choice, Veterans' Benefits)  Household income should be no more than 200% of the poverty level The clinic cannot treat you if you are pregnant or think you are pregnant  Sexually transmitted diseases are not treated at the clinic.    Dental Care: Organization         Address  Phone  Notes  [facility] of [facility] [address], [place] [phone]) [phone] Accepts children up to age 22 who are enrolled in [place] or [place] Health Choice; pregnant women with a Medicaid card; and children who have applied for Medicaid or [place] Health Choice, but were declined, whose parents can pay a reduced fee at time of service.  [facility] of [facility] [place]  [address], [place] [phone] Accepts children up to age 22 who are enrolled in [place] or  Health Choice; pregnant women with a Medicaid card; and children who have applied for Medicaid or  Health Choice, but were declined, whose parents can pay a reduced fee at time of service.  [facility]  [address], [place] [phone] Patients are seen by appointment only. Walk-ins  are not accepted. [facility] will see patients 22 years of age and older. Monday - Tuesday (8am-5pm) Most Wednesdays (8:30-5pm) $30 per  visit, cash only  [facility] Adult [employer] PROGRAM  [address], [facility] [phone] Patients are seen by appointment only. Walk-ins are not accepted. [facility] will see patients 28 years of age and older. One Wednesday Evening (Monthly: Volunteer Based).  $30 per visit, cash only  [employer]  [phone] for adults; Children under age 83, call Graduate Pediatric Dentistry at [phone]. Children aged 90-14, please call [phone] to request a pediatric application.  Dental services are provided in all areas of dental care including fillings, crowns and bridges, complete and partial dentures, implants, gum treatment, root canals, and extractions. Preventive care is also provided. Treatment is provided to both adults and children. Patients are selected via a lottery and there is often a waiting list.   [facility] [address], [place]  [phone] [email or web address]   [facility] [address] [place], [place] [phone], Ext. [phone] Second and Fourth Thursday of each month, opens at 6:30 AM; Clinic ends at 9 AM.  Patients are seen on a first-come first-served basis, and a limited number are seen during each clinic.   [facility]  [address] [place], [place] [phone]   Eligibility Requirements You must have lived in [place], [place], or [place] counties for at least the last three months.   You cannot be eligible for state or federal sponsored [place], including [employer], [place], or [employer].   You generally cannot be eligible for healthcare insurance through your employer.    How to apply: Eligibility screenings are held every Tuesday and Wednesday afternoon from 1:00 pm until 4:00 pm. You do not need an appointment  for the interview!  [facility] [address] [place], [place] [phone]   [facility] Health Department  [phone]   [facility] Health Department  [phone]   [facility] Health Department  [phone]    Behavioral Health Resources in the Community: Intensive Outpatient Programs Organization         Address  Phone  Notes  [facility] Services [address] [place], [place] [phone]   [facility] Outpatient [address] [place], [place] [phone]   ADS: Alcohol & Drug Svcs [address], [place], [place]  [phone]   [facility] Mental Health [address]  [place], [place] [phone] or [phone]   Substance Abuse Resources Organization         Address  Phone  Notes  Alcohol and Drug Services  [phone]   [facility]  [phone]   [facility]  [phone]   Floydene Flock  [phone]   Residential & Outpatient Substance Abuse Program  [phone]   Psychological Services Organization         Address  Phone  Notes  [facility]  [phone]   [facility] Services  [phone]   [facility] [address], [place] [phone] or [phone]    Mobile Crisis Teams Organization         Address  Phone  Notes  [facility], Mobile Crisis Care Unit  [phone]   [facility]  [address] [place], [place] [phone]   Doristine Locks [address] [place] [phone]    Self-Help/Support Groups Organization         Address  Phone  Notes  Mental Health Assoc. of [place] - variety of support groups  [place] Call for more information  Narcotics Anonymous (NA), Caring [facility] [address], [employer] [place]  2 meetings at this location   Special educational needs teacher         Address  Phone  Notes  ASAP Residential  Treatment [place],    [place]  [phone]   [facility]  [address], [place] [phone], [place], [place]   [place], [place] [phone] Admissions: 8am-3pm M-F  Incentives Substance [place] [address],    [place], [place] [phone]   [facility] [address] [place], [place], [place]   [facility] [address]  [place], [place]   Insight Programs - Intensive Outpatient [address], [place], [place]   [facility] ([place].,  [place], [place] [phone] or [phone]   [facility] Services (RTS) [address] [place], [place] Accepts Medicaid  [facility] [address],  [place] [phone] Substance Abuse/Addiction Treatment   [facility] Organization         Address  Phone  Notes  [facility]  [phone]   Domenic Schwab, PhD [address] Arlis Porta [place], [place]   [phone] or [phone]   [place], [place] [phone]   [facility] [address] [place], [place] [phone] Insurance/Medicaid/sponsorship through [facility] and Families [address]                                    [place], [place] [phone] [place] [address], [place] [phone]    Dr. Adele Schilder  [phone]   [facility] Dept. 1) [address], [place] 2) [place] 3)  [place] [address], [place] [phone]  [phone]   [place] [phone] or [phone] (After Hours)

## 2015-11-29 NOTE — ED Notes (Signed)
Pt leaving AMA. 

## 2015-11-29 NOTE — ED Notes (Signed)
Pt complaint of right foot swelling/pain for a week; no injury. Pt also complaint of headache ongoing since September; hx hypertension; out of medication since October.

## 2015-12-24 ENCOUNTER — Encounter (HOSPITAL_COMMUNITY): Payer: Self-pay | Admitting: Emergency Medicine

## 2015-12-24 ENCOUNTER — Emergency Department (HOSPITAL_COMMUNITY)
Admission: EM | Admit: 2015-12-24 | Discharge: 2015-12-24 | Discharge status: Against Medical Advice | Payer: Non-veteran care | Attending: Emergency Medicine | Admitting: Emergency Medicine

## 2015-12-24 DIAGNOSIS — R51 Headache: Secondary | ICD-10-CM | POA: Diagnosis present

## 2015-12-24 DIAGNOSIS — B20 Human immunodeficiency virus [HIV] disease: Secondary | ICD-10-CM | POA: Diagnosis not present

## 2015-12-24 DIAGNOSIS — I10 Essential (primary) hypertension: Secondary | ICD-10-CM | POA: Diagnosis not present

## 2015-12-24 DIAGNOSIS — J029 Acute pharyngitis, unspecified: Secondary | ICD-10-CM | POA: Insufficient documentation

## 2015-12-24 NOTE — ED Notes (Signed)
Patient refused to wait, wanted to be seen immediately.  It was explained to patient that there was a long wait.  Patient did not want to wait and left triage.

## 2015-12-24 NOTE — ED Notes (Signed)
Patient with headache, sore throat, chest pain and weakness.  This has been going on since Sunday.  Patient does have nausea, no vomiting at this time.

## 2016-04-28 ENCOUNTER — Encounter (HOSPITAL_COMMUNITY): Payer: Self-pay | Admitting: Oncology

## 2016-04-28 ENCOUNTER — Emergency Department (HOSPITAL_COMMUNITY)
Admission: EM | Admit: 2016-04-28 | Discharge: 2016-04-29 | Disposition: A | Discharge status: Home / Self Care | Payer: Non-veteran care | Attending: Emergency Medicine | Admitting: Emergency Medicine

## 2016-04-28 DIAGNOSIS — Z791 Long term (current) use of non-steroidal anti-inflammatories (NSAID): Secondary | ICD-10-CM | POA: Insufficient documentation

## 2016-04-28 DIAGNOSIS — R110 Nausea: Secondary | ICD-10-CM

## 2016-04-28 DIAGNOSIS — R197 Diarrhea, unspecified: Secondary | ICD-10-CM | POA: Insufficient documentation

## 2016-04-28 DIAGNOSIS — I10 Essential (primary) hypertension: Secondary | ICD-10-CM | POA: Insufficient documentation

## 2016-04-28 LAB — LIPASE, BLOOD: Lipase: 13 U/L (ref 11–51)

## 2016-04-28 LAB — COMPREHENSIVE METABOLIC PANEL
ALBUMIN: 4.5 g/dL (ref 3.5–5.0)
ALK PHOS: 82 U/L (ref 38–126)
ALT: 25 U/L (ref 17–63)
AST: 34 U/L (ref 15–41)
Anion gap: 7 (ref 5–15)
BILIRUBIN TOTAL: 1.6 mg/dL — AB (ref 0.3–1.2)
BUN: 9 mg/dL (ref 6–20)
CALCIUM: 9.3 mg/dL (ref 8.9–10.3)
CO2: 26 mmol/L (ref 22–32)
Chloride: 106 mmol/L (ref 101–111)
Creatinine, Ser: 1.19 mg/dL (ref 0.61–1.24)
GFR calc Af Amer: >60 mL/min (ref >60)
GFR calc non Af Amer: >60 mL/min (ref >60)
GLUCOSE: 101 mg/dL — AB (ref 65–99)
Potassium: 3.6 mmol/L (ref 3.5–5.1)
Sodium: 139 mmol/L (ref 135–145)
TOTAL PROTEIN: 7.3 g/dL (ref 6.5–8.1)

## 2016-04-28 LAB — URINE MICROSCOPIC-ADD ON: RBC / HPF: NONE SEEN RBC/hpf (ref 0–5)

## 2016-04-28 LAB — URINALYSIS, ROUTINE W REFLEX MICROSCOPIC
Glucose, UA: NEGATIVE mg/dL
Hgb urine dipstick: NEGATIVE
KETONES UR: NEGATIVE mg/dL
NITRITE: NEGATIVE
Protein, ur: NEGATIVE mg/dL
Specific Gravity, Urine: 1.038 — ABNORMAL HIGH (ref 1.005–1.030)
pH: 6 (ref 5.0–8.0)

## 2016-04-28 LAB — CBC
HCT: 43.8 % (ref 39.0–52.0)
Hemoglobin: 15.5 g/dL (ref 13.0–17.0)
MCH: 29.9 pg (ref 26.0–34.0)
MCHC: 35.4 g/dL (ref 30.0–36.0)
MCV: 84.4 fL (ref 78.0–100.0)
Platelets: 215 10*3/uL (ref 150–400)
RBC: 5.19 MIL/uL (ref 4.22–5.81)
RDW: 12.8 % (ref 11.5–15.5)
WBC: 4.8 10*3/uL (ref 4.0–10.5)

## 2016-04-28 MED ORDER — ONDANSETRON HCL 4 MG/2ML IJ SOLN: 4.0000 mg | Freq: Once | INTRAMUSCULAR | Status: DC

## 2016-04-28 MED ORDER — SODIUM CHLORIDE 0.9 % IV BOLUS (SEPSIS): 1000.0000 mL | Freq: Once | INTRAVENOUS | Status: DC

## 2016-04-28 NOTE — ED Notes (Signed)
Pt c/o nausea x 1 week.  Does use mariajuana daily.  Also c/o generalized weakness and feeling like his throat is being "squeezed."  Pt is speaking in full sentences in NAD at this time.

## 2016-04-28 NOTE — ED Provider Notes (Signed)
CSN: [id number]     Arrival date & time 04/28/16  1912 History  By signing my name below, I, Bethel Born, attest that this documentation has been prepared under the direction and in the presence of Elpidio Anis, PA-C Electronically Signed: Bethel Born, ED Scribe. 04/28/2016 11:17 PM   Chief Complaint  Patient presents with  . Nausea    The history is provided by the patient. No language interpreter was used.   Danny Camacho is a 25 y.o. male with PMHx of HIV and HTN who presents to the Emergency Department complaining of severe nausea with onset 5 days ago. Pt states that he has had similar symptoms in the past that were improved after a pill under his tongue.  Associated symptoms include diarrhea (2-3 episodes of non-bloody diarrhea daily), abdominal pain in the morning, and throat pain. He has been eating and drinking normally.  Pt denies fever, vomiting, and difficulty urinating. No history of abdominal surgery.  Pt states that he has been taking all of his medication with no recent change. His PCP is Dr. Kathie Rhodes. [facility]  in [place].   Past Medical History  Diagnosis Date  . Hypertension   . HIV (human immunodeficiency virus infection) [facility])    Past Surgical History  Procedure Laterality Date  . Tonsillectomy    . Fracture surgery      left leg fx w/ rod placement   No family history on file. Social History  Substance Use Topics  . Smoking status: Never Smoker   . Smokeless tobacco: Never Used  . Alcohol Use: No    Review of Systems  Constitutional: Negative for fever.  HENT:       Throat pain   Gastrointestinal: Positive for nausea, abdominal pain and diarrhea. Negative for vomiting.  Genitourinary: Negative for difficulty urinating.    Allergies  Penicillins  Home Medications   Prior to Admission medications   Medication Sig Start Date End Date Taking? Authorizing Provider  abacavir-dolutegravir-lamiVUDine (TRIUMEQ) 600-50-300 MG tablet Take 1  tablet by mouth at bedtime.   Yes Historical Provider, MD  butalbital-acetaminophen-caffeine (FIORICET WITH CODEINE) 50-325-40-30 MG capsule Take 1 capsule by mouth every 4 (four) hours as needed for headache.   Yes Historical Provider, MD  cetirizine (ZYRTEC) 10 MG tablet Take 10 mg by mouth daily.   Yes Historical Provider, MD  fluticasone (FLONASE) 50 MCG/ACT nasal spray Place 2 sprays into both nostrils daily.   Yes Historical Provider, MD  lisinopril (PRINIVIL,ZESTRIL) 20 MG tablet Take 1 tablet (20 mg total) by mouth daily. 11/29/15  Yes Mady Gemma, PA-C  albuterol (PROVENTIL HFA;VENTOLIN HFA) 108 (90 BASE) MCG/ACT inhaler Inhale 1 puff into the lungs every 6 (six) hours as needed for wheezing or shortness of breath.    Historical Provider, MD  ibuprofen (ADVIL,MOTRIN) 600 MG tablet Take 1 tablet (600 mg total) by mouth every 6 (six) hours as needed. Patient not taking: Reported on 04/28/2016 08/13/15   Lyndal Pulley, MD   BP 136/71 mmHg  Pulse 91  Temp(Src) 97.5 F (36.4 C) (Oral)  Resp 16  Ht  (1.778 m)  Wt 189 lb 6.4 oz (85.911 kg)  BMI 27.18 kg/m2  SpO2 100% Physical Exam  Constitutional: He is oriented to person, place, and time. He appears well-developed and well-nourished. No distress.  Well-appearing and in NAD   HENT:  Head: Normocephalic and atraumatic.  Eyes: Conjunctivae and EOM are normal.  Neck: Neck supple. No tracheal deviation present.  Cardiovascular: Normal rate and regular rhythm.   Pulmonary/Chest: Effort normal and breath sounds normal. No respiratory distress.  Abdominal: Bowel sounds are normal. He exhibits no mass. There is no tenderness.  Musculoskeletal: Normal range of motion.  Neurological: He is alert and oriented to person, place, and time.  Skin: Skin is warm and dry.  Psychiatric: He has a normal mood and affect. His behavior is normal.  Nursing note and vitals reviewed.   ED Course  Procedures (including critical care  time) DIAGNOSTIC STUDIES: Oxygen Saturation is 100% on RA,  normal by my interpretation.    COORDINATION OF CARE: 11:10 PM Discussed treatment plan which includes lab work and Zofran with pt at bedside and pt agreed to plan.  Labs Review Labs Reviewed  COMPREHENSIVE METABOLIC PANEL - Abnormal; Notable for the following:    Glucose, Bld 101 (*)    Total Bilirubin 1.6 (*)    All other components within normal limits  LIPASE, BLOOD  CBC  URINALYSIS, ROUTINE W REFLEX MICROSCOPIC (NOT AT [facility])   Results for orders placed or performed during the hospital encounter of 04/28/16  Lipase, blood  Result Value Ref Range   Lipase 13 11 - 51 U/L  Comprehensive metabolic panel  Result Value Ref Range   Sodium 139 135 - 145 mmol/L   Potassium 3.6 3.5 - 5.1 mmol/L   Chloride 106 101 - 111 mmol/L   CO2 26 22 - 32 mmol/L   Glucose, Bld 101 (H) 65 - 99 mg/dL   BUN 9 6 - 20 mg/dL   Creatinine, Ser 1.61 0.61 - 1.24 mg/dL   Calcium 9.3 8.9 - 09.6 mg/dL   Total Protein 7.3 6.5 - 8.1 g/dL   Albumin 4.5 3.5 - 5.0 g/dL   AST 34 15 - 41 U/L   ALT 25 17 - 63 U/L   Alkaline Phosphatase 82 38 - 126 U/L   Total Bilirubin 1.6 (H) 0.3 - 1.2 mg/dL   GFR calc non Af Amer >60 >60 mL/min   GFR calc Af Amer >60 >60 mL/min   Anion gap 7 5 - 15  CBC  Result Value Ref Range   WBC 4.8 4.0 - 10.5 K/uL   RBC 5.19 4.22 - 5.81 MIL/uL   Hemoglobin 15.5 13.0 - 17.0 g/dL   HCT 04.5 40.9 - 81.1 %   MCV 84.4 78.0 - 100.0 fL   MCH 29.9 26.0 - 34.0 pg   MCHC 35.4 30.0 - 36.0 g/dL   RDW 91.4 78.2 - 95.6 %   Platelets 215 150 - 400 K/uL  Urinalysis, Routine w reflex microscopic  Result Value Ref Range   Color, Urine AMBER (A) YELLOW   APPearance CLOUDY (A) CLEAR   Specific Gravity, Urine 1.038 (H) 1.005 - 1.030   pH 6.0 5.0 - 8.0   Glucose, UA NEGATIVE NEGATIVE mg/dL   Hgb urine dipstick NEGATIVE NEGATIVE   Bilirubin Urine SMALL (A) NEGATIVE   Ketones, ur NEGATIVE NEGATIVE mg/dL   Protein, ur NEGATIVE NEGATIVE  mg/dL   Nitrite NEGATIVE NEGATIVE   Leukocytes, UA TRACE (A) NEGATIVE  Urine microscopic-add on  Result Value Ref Range   Squamous Epithelial / LPF 0-5 (A) NONE SEEN   WBC, UA 0-5 0 - 5 WBC/hpf   RBC / HPF NONE SEEN 0 - 5 RBC/hpf   Bacteria, UA FEW (A) NONE SEEN   Urine-Other MUCOUS PRESENT      Imaging Review No results found. I have personally reviewed and evaluated these  lab results as part of my medical decision-making.   EKG Interpretation None      MDM   Final diagnoses:  None    1. Nausea 2. Diarrhea  The patient presents with 5 days of diarrhea (2-3 per day) and nausea, daily/intermittent. No fever, significant pain. No blood in emesis or bowel movements. He reports intermittent history of same.   IVF's and Zofran ordered but patient declined, stating he needs to leave with his ride. He is well appearing, stable, VSS. He can be discharged home with Rx Zofran and encouraged to follow up with PCP for recheck if symptoms persist.   I personally performed the services described in this documentation, which was scribed in my presence. The recorded information has been reviewed and is accurate.     Elpidio AnisShari Christinia Lambeth, PA-C 04/28/16 2358  Gilda Creasehristopher J Pollina, MD 04/29/16 [id number]

## 2016-04-29 MED ORDER — ONDANSETRON 4 MG PO TBDP: 4.0000 mg | ORAL_TABLET | Freq: Three times a day (TID) | ORAL | Status: DC | PRN

## 2016-04-29 NOTE — ED Notes (Signed)
PT LEFT WITHOUT DISCHARGE INSTRUCTIONS AND MEDICATIONS

## 2016-04-29 NOTE — Discharge Instructions (Signed)
Diarrhea  Diarrhea is frequent loose and watery bowel movements. It can cause you to feel weak and dehydrated. Dehydration can cause you to become tired and thirsty, have a dry mouth, and have decreased urination that often is dark yellow. Diarrhea is a sign of another problem, most often an infection that will not last long. In most cases, diarrhea typically lasts 2-3 days. However, it can last longer if it is a sign of something more serious. It is important to treat your diarrhea as directed by your caregiver to lessen or prevent future episodes of diarrhea.  CAUSES   Some common causes include:   Gastrointestinal infections caused by viruses, bacteria, or parasites.   Food poisoning or food allergies.   Certain medicines, such as antibiotics, chemotherapy, and laxatives.   Artificial sweeteners and fructose.   Digestive disorders.  HOME CARE INSTRUCTIONS   Ensure adequate fluid intake (hydration): Have 1 cup (8 oz) of fluid for each diarrhea episode. Avoid fluids that contain simple sugars or sports drinks, fruit juices, whole milk products, and sodas. Your urine should be clear or pale yellow if you are drinking enough fluids. Hydrate with an oral rehydration solution that you can purchase at pharmacies, retail stores, and online. You can prepare an oral rehydration solution at home by mixing the following ingredients together:    ?-? tsp table salt.     tsp baking soda.    ? tsp salt substitute containing potassium chloride.    1 ? tablespoons sugar.    1 L (34 oz) of water.   Certain foods and beverages may increase the speed at which food moves through the gastrointestinal (GI) tract. These foods and beverages should be avoided and include:    Caffeinated and alcoholic beverages.    High-fiber foods, such as raw fruits and vegetables, nuts, seeds, and whole grain breads and cereals.    Foods and beverages sweetened with sugar alcohols, such as xylitol, sorbitol, and mannitol.   Some foods may be  well tolerated and may help thicken stool including:    Starchy foods, such as rice, toast, pasta, low-sugar cereal, oatmeal, grits, baked potatoes, crackers, and bagels.    Bananas.    Applesauce.   Add probiotic-rich foods to help increase healthy bacteria in the GI tract, such as yogurt and fermented milk products.   Wash your hands well after each diarrhea episode.   Only take over-the-counter or prescription medicines as directed by your caregiver.   Take a warm bath to relieve any burning or pain from frequent diarrhea episodes.  SEEK IMMEDIATE MEDICAL CARE IF:    You are unable to keep fluids down.   You have persistent vomiting.   You have blood in your stool, or your stools are black and tarry.   You do not urinate in 6-8 hours, or there is only a small amount of very dark urine.   You have abdominal pain that increases or localizes.   You have weakness, dizziness, confusion, or light-headedness.   You have a severe headache.   Your diarrhea gets worse or does not get better.   You have a fever or persistent symptoms for more than 2-3 days.   You have a fever and your symptoms suddenly get worse.  MAKE SURE YOU:    Understand these instructions.   Will watch your condition.   Will get help right away if you are not doing well or get worse.     This information is not intended   to replace advice given to you by your health care provider. Make sure you discuss any questions you have with your health care provider.     Document Released: 11/17/2002 Document Revised: 12/18/2014 Document Reviewed: 08/04/2012  Elsevier Interactive Patient Education 2016 [employer] Inc[employer]  Nausea, Adult  Nausea is the feeling that you have an upset stomach or have to vomit. Nausea by itself is not likely a serious concern, but it may be an early sign of more serious medical problems. As nausea gets worse, it can lead to vomiting. If vomiting develops, there is the risk of dehydration.   CAUSES    Viral  infections.   Food poisoning.   Medicines.   Pregnancy.   Motion sickness.   Migraine headaches.   Emotional distress.   Severe pain from any source.   Alcohol intoxication.  HOME CARE INSTRUCTIONS   Get plenty of rest.   Ask your caregiver about specific rehydration instructions.   Eat small amounts of food and sip liquids more often.   Take all medicines as told by your caregiver.  SEEK MEDICAL CARE IF:   You have not improved after 2 days, or you get worse.   You have a headache.  SEEK IMMEDIATE MEDICAL CARE IF:    You have a fever.   You faint.   You keep vomiting or have blood in your vomit.   You are extremely weak or dehydrated.   You have dark or bloody stools.   You have severe chest or abdominal pain.  MAKE SURE YOU:   Understand these instructions.   Will watch your condition.   Will get help right away if you are not doing well or get worse.     This information is not intended to replace advice given to you by your health care provider. Make sure you discuss any questions you have with your health care provider.     Document Released: 01/04/2005 Document Revised: 12/18/2014 Document Reviewed: 08/09/2011  Elsevier Interactive Patient Education 2016 Elsevier Inc.

## 2016-09-08 ENCOUNTER — Emergency Department (HOSPITAL_COMMUNITY): Payer: Non-veteran care

## 2016-09-08 ENCOUNTER — Encounter (HOSPITAL_COMMUNITY): Payer: Self-pay | Admitting: *Deleted

## 2016-09-08 ENCOUNTER — Emergency Department (HOSPITAL_COMMUNITY)
Admission: EM | Admit: 2016-09-08 | Discharge: 2016-09-08 | Disposition: A | Discharge status: Home / Self Care | Payer: Non-veteran care | Attending: Emergency Medicine | Admitting: Emergency Medicine

## 2016-09-08 DIAGNOSIS — G43009 Migraine without aura, not intractable, without status migrainosus: Secondary | ICD-10-CM

## 2016-09-08 DIAGNOSIS — G43909 Migraine, unspecified, not intractable, without status migrainosus: Secondary | ICD-10-CM | POA: Insufficient documentation

## 2016-09-08 DIAGNOSIS — Z79899 Other long term (current) drug therapy: Secondary | ICD-10-CM | POA: Diagnosis not present

## 2016-09-08 DIAGNOSIS — I10 Essential (primary) hypertension: Secondary | ICD-10-CM | POA: Insufficient documentation

## 2016-09-08 DIAGNOSIS — R05 Cough: Secondary | ICD-10-CM

## 2016-09-08 DIAGNOSIS — R059 Cough, unspecified: Secondary | ICD-10-CM

## 2016-09-08 DIAGNOSIS — J069 Acute upper respiratory infection, unspecified: Secondary | ICD-10-CM | POA: Insufficient documentation

## 2016-09-08 LAB — COMPREHENSIVE METABOLIC PANEL
ALBUMIN: 4.3 g/dL (ref 3.5–5.0)
ALK PHOS: 88 U/L (ref 38–126)
ALT: 17 U/L (ref 17–63)
AST: 19 U/L (ref 15–41)
Anion gap: 5 (ref 5–15)
BILIRUBIN TOTAL: 1 mg/dL (ref 0.3–1.2)
BUN: 12 mg/dL (ref 6–20)
CALCIUM: 8.7 mg/dL — AB (ref 8.9–10.3)
CO2: 28 mmol/L (ref 22–32)
CREATININE: 1.07 mg/dL (ref 0.61–1.24)
Chloride: 105 mmol/L (ref 101–111)
GFR calc Af Amer: >60 mL/min (ref >60)
GFR calc non Af Amer: >60 mL/min (ref >60)
GLUCOSE: 93 mg/dL (ref 65–99)
Potassium: 4.1 mmol/L (ref 3.5–5.1)
SODIUM: 138 mmol/L (ref 135–145)
TOTAL PROTEIN: 7.3 g/dL (ref 6.5–8.1)

## 2016-09-08 LAB — CBC WITH DIFFERENTIAL/PLATELET
BASOS ABS: 0 10*3/uL (ref 0.0–0.1)
BASOS PCT: 0 %
EOS ABS: 0.5 10*3/uL (ref 0.0–0.7)
Eosinophils Relative: 6 %
HEMATOCRIT: 47 % (ref 39.0–52.0)
HEMOGLOBIN: 15.8 g/dL (ref 13.0–17.0)
Lymphocytes Relative: 23 %
Lymphs Abs: 1.8 10*3/uL (ref 0.7–4.0)
MCH: 29.3 pg (ref 26.0–34.0)
MCHC: 33.6 g/dL (ref 30.0–36.0)
MCV: 87.2 fL (ref 78.0–100.0)
MONOS PCT: 13 %
Monocytes Absolute: 1.1 10*3/uL — ABNORMAL HIGH (ref 0.1–1.0)
NEUTROS ABS: 4.6 10*3/uL (ref 1.7–7.7)
NEUTROS PCT: 58 %
Platelets: 210 10*3/uL (ref 150–400)
RBC: 5.39 MIL/uL (ref 4.22–5.81)
RDW: 13.1 % (ref 11.5–15.5)
WBC: 7.9 10*3/uL (ref 4.0–10.5)

## 2016-09-08 LAB — T-HELPER CELLS (CD4) COUNT (NOT AT ARMC)
CD4 % Helper T Cell: 24 % — ABNORMAL LOW (ref 33–55)
CD4 T CELL ABS: 430 /uL (ref 400–2700)

## 2016-09-08 MED ORDER — DIPHENHYDRAMINE HCL 50 MG/ML IJ SOLN
25.0000 mg | Freq: Once | INTRAMUSCULAR | Status: AC
  Administered 2016-09-08: 25 mg via INTRAVENOUS
  Filled 2016-09-08: qty 1

## 2016-09-08 MED ORDER — KETOROLAC TROMETHAMINE 30 MG/ML IJ SOLN
30.0000 mg | Freq: Once | INTRAMUSCULAR | Status: AC
  Administered 2016-09-08: 30 mg via INTRAVENOUS
  Filled 2016-09-08: qty 1

## 2016-09-08 MED ORDER — METOCLOPRAMIDE HCL 5 MG/ML IJ SOLN
10.0000 mg | Freq: Once | INTRAMUSCULAR | Status: AC
  Administered 2016-09-08: 10 mg via INTRAVENOUS
  Filled 2016-09-08: qty 2

## 2016-09-08 MED ORDER — SODIUM CHLORIDE 0.9 % IV BOLUS (SEPSIS)
1000.0000 mL | Freq: Once | INTRAVENOUS | Status: AC
  Administered 2016-09-08: 1000 mL via INTRAVENOUS

## 2016-09-08 MED ORDER — BENZONATATE 100 MG PO CAPS: 100.0000 mg | ORAL_CAPSULE | Freq: Three times a day (TID) | ORAL | 0 refills | Status: DC

## 2016-09-08 MED ORDER — LISINOPRIL 20 MG PO TABS: 20.0000 mg | ORAL_TABLET | Freq: Once | ORAL | Status: DC

## 2016-09-08 NOTE — ED Notes (Signed)
MD at bedside. 

## 2016-09-08 NOTE — Discharge Instructions (Signed)
Continue to use her over-the-counter medications for sinus congestion. He may also use nasal saline washes. Tylenol or ibuprofen for pain. May take Tessalon for cough. Please follow-up with your primary doctor if symptoms persist. Return to the ED if you develop fever, neck stiffness, abdominal pain.

## 2016-09-08 NOTE — ED Provider Notes (Signed)
[facility]-EMERGENCY DEPT Provider Note   CSN: [id number] Arrival date & time: 09/08/16  4098     History   Chief Complaint Chief Complaint  Patient presents with  . URI    HPI Danny Camacho is a 25 y.o. male.  25 year old African-American male past medical history significant for hypertension and HIV presents to the ED today with URI symptoms that started approximately 2 days ago. Patient states that he developed a sore throat approximately 2 days ago. Also endorses rhinorrhea, sinus congestion, chest congestion and cough. States he his coughing up phlegm. Patient states that yesterday he started having generalized body aches. He has been trying over-the-counter Mucinex and TheraFlu with little relief. Nothing makes better or worse. Patient states that he had an episode of emesis yesterday but none since then. States he woke up this morning with a migraine. Patient history of migraines and he states this feels the same. Subjective fevers at home. Denies any lightheadedness, dizziness, vision changes, chest pain, shortness of breath, abdominal pain, urinary signs, change in bowel habits, numbness/tingling. Patients states he is currently taking all of his medications. He did not take his bp meds this morning.      Past Medical History:  Diagnosis Date  . HIV (human immunodeficiency virus infection) ([facility])   . Hypertension     There are no active problems to display for this patient.   Past Surgical History:  Procedure Laterality Date  . FRACTURE SURGERY     left leg fx w/ rod placement  . TONSILLECTOMY         Home Medications    Prior to Admission medications   Medication Sig Start Date End Date Taking? Authorizing Provider  abacavir-dolutegravir-lamiVUDine (TRIUMEQ) 600-50-300 MG tablet Take 1 tablet by mouth at bedtime.    Historical Provider, MD  albuterol (PROVENTIL HFA;VENTOLIN HFA) 108 (90 BASE) MCG/ACT inhaler Inhale 1 puff into the lungs every 6 (six) hours as needed  for wheezing or shortness of breath.    Historical Provider, MD  butalbital-acetaminophen-caffeine (FIORICET WITH CODEINE) 50-325-40-30 MG capsule Take 1 capsule by mouth every 4 (four) hours as needed for headache.    Historical Provider, MD  cetirizine (ZYRTEC) 10 MG tablet Take 10 mg by mouth daily.    Historical Provider, MD  fluticasone (FLONASE) 50 MCG/ACT nasal spray Place 2 sprays into both nostrils daily.    Historical Provider, MD  ibuprofen (ADVIL,MOTRIN) 600 MG tablet Take 1 tablet (600 mg total) by mouth every 6 (six) hours as needed. Patient not taking: Reported on 04/28/2016 08/13/15   Lyndal Pulley, MD  lisinopril (PRINIVIL,ZESTRIL) 20 MG tablet Take 1 tablet (20 mg total) by mouth daily. 11/29/15   Mady Gemma, PA-C  ondansetron (ZOFRAN ODT) 4 MG disintegrating tablet Take 1 tablet (4 mg total) by mouth every 8 (eight) hours as needed for nausea or vomiting. 04/29/16   Elpidio Anis, PA-C    Family History No family history on file.  Social History Social History  Substance Use Topics  . Smoking status: Never Smoker  . Smokeless tobacco: Never Used  . Alcohol use No     Allergies   Penicillins   Review of Systems Review of Systems  Constitutional: Positive for fatigue. Negative for chills and fever.  HENT: Positive for congestion, postnasal drip, rhinorrhea, sinus pressure and sore throat. Negative for ear pain.   Eyes: Negative for photophobia and pain.  Respiratory: Positive for cough. Negative for shortness of breath.   Cardiovascular: Negative for chest pain  and palpitations.  Gastrointestinal: Negative for abdominal pain, diarrhea, nausea and vomiting.  Genitourinary: Negative for dysuria, flank pain, frequency, hematuria and urgency.  Musculoskeletal: Negative for neck pain and neck stiffness.  Skin: Negative.   Neurological: Positive for headaches. Negative for dizziness, syncope, weakness, light-headedness and numbness.     Physical Exam Updated  Vital Signs BP 140/91 (BP Location: Left Arm)   Pulse 83   Temp 98.1 F (36.7 C) (Oral)   Resp 16   Ht 5\' 9"  (1.753 m)   Wt 86.2 kg   SpO2 100%   BMI 28.06 kg/m   Physical Exam  Constitutional: He is oriented to person, place, and time. He appears well-developed and well-nourished. No distress.  HENT:  Head: Normocephalic and atraumatic.  Right Ear: Tympanic membrane, external ear and ear canal normal.  Left Ear: Tympanic membrane, external ear and ear canal normal.  Nose: Mucosal edema and rhinorrhea present.  Mouth/Throat: Oropharynx is clear and moist and mucous membranes are normal. No oropharyngeal exudate, posterior oropharyngeal edema, posterior oropharyngeal erythema or tonsillar abscesses.  Eyes: Conjunctivae and EOM are normal. Pupils are equal, round, and reactive to light. Right eye exhibits no discharge. Left eye exhibits no discharge. No scleral icterus.  Neck: Normal range of motion. Neck supple. No thyromegaly present.  Cardiovascular: Normal rate, regular rhythm, normal heart sounds and intact distal pulses.   Pulmonary/Chest: Effort normal and breath sounds normal. No respiratory distress. He has no wheezes.  Cough noted on exam  Abdominal: Soft. Bowel sounds are normal. He exhibits no distension. There is no tenderness. There is no rebound and no guarding.  Musculoskeletal: Normal range of motion.  Lymphadenopathy:    He has no cervical adenopathy.  Neurological: He is alert and oriented to person, place, and time. He has normal strength. No cranial nerve deficit or sensory deficit. He exhibits normal muscle tone. Coordination normal. GCS eye subscore is 4. GCS verbal subscore is 5. GCS motor subscore is 6.  Skin: Skin is warm and dry. Capillary refill takes less than 2 seconds.  Vitals reviewed.    ED Treatments / Results  Labs (all labs ordered are listed, but only abnormal results are displayed) Labs Reviewed - No data to display  EKG  EKG  Interpretation None       Radiology No results found.  Procedures Procedures (including critical care time)  Medications Ordered in ED Medications  sodium chloride 0.9 % bolus 1,000 mL (0 mLs Intravenous Stopped 09/08/16 1038)  ketorolac (TORADOL) 30 MG/ML injection 30 mg (30 mg Intravenous Given 09/08/16 0922)  diphenhydrAMINE (BENADRYL) injection 25 mg (25 mg Intravenous Given 09/08/16 0924)  metoCLOPramide (REGLAN) injection 10 mg (10 mg Intravenous Given 09/08/16 0919)     Initial Impression / Assessment and Plan / ED Course  I have reviewed the triage vital signs and the nursing notes.  Pertinent labs & imaging results that were available during my care of the patient were reviewed by me and considered in my medical decision making (see chart for details).  Clinical Course  Patient presents to ED with URI symptoms and headache. Pt CXR negative for acute infiltrate. Patients symptoms are consistent with URI, likely viral etiology. Discussed that antibiotics are not indicated for viral infections. Pt will be discharged with symptomatic treatment.  Labs were unremarkable. Patient with migraine. History of same. Patient states feels like migraine. No neuro deficits or nuchal rigidity. Migraine cocktail and IV fluids given with significant improvement. CD4 cell count 430. Patient bp  slightly elevated in ED. Patient states he did not take his BP meds this morning. Patient verbalizes understanding and is agreeable with plan. Pt is hemodynamically stable & in NAD prior to dc. Patient discussed with Dr. Eudelia Bunch who agrees with the above plan. Discharged home in NAD with stable vs. Strict return precautions discussed.   Final Clinical Impressions(s) / ED Diagnoses   Final diagnoses:  URI (upper respiratory infection)  Cough  Migraine without aura and without status migrainosus, not intractable    New Prescriptions Discharge Medication List as of 09/08/2016 11:55 AM    START taking  these medications   Details  benzonatate (TESSALON) 100 MG capsule Take 1 capsule (100 mg total) by mouth every 8 (eight) hours., Starting Fri 09/08/2016, Print         Rise Mu, PA-C 09/08/16 2011    Nira Conn, MD 09/15/16 [phone]

## 2016-09-08 NOTE — ED Triage Notes (Signed)
Pt states 2 days ago headache, weakness, cough that is productive and ? Fever. Vomited one time yesterday.

## 2016-10-28 ENCOUNTER — Emergency Department (HOSPITAL_COMMUNITY)
Admission: EM | Admit: 2016-10-28 | Discharge: 2016-10-28 | Disposition: A | Discharge status: Home / Self Care | Payer: Non-veteran care | Attending: Emergency Medicine | Admitting: Emergency Medicine

## 2016-10-28 ENCOUNTER — Emergency Department (HOSPITAL_COMMUNITY): Payer: Non-veteran care

## 2016-10-28 ENCOUNTER — Encounter (HOSPITAL_COMMUNITY): Payer: Self-pay | Admitting: Emergency Medicine

## 2016-10-28 DIAGNOSIS — Y939 Activity, unspecified: Secondary | ICD-10-CM | POA: Diagnosis not present

## 2016-10-28 DIAGNOSIS — Y929 Unspecified place or not applicable: Secondary | ICD-10-CM | POA: Insufficient documentation

## 2016-10-28 DIAGNOSIS — S0990XA Unspecified injury of head, initial encounter: Secondary | ICD-10-CM | POA: Diagnosis present

## 2016-10-28 DIAGNOSIS — S0083XA Contusion of other part of head, initial encounter: Secondary | ICD-10-CM | POA: Insufficient documentation

## 2016-10-28 DIAGNOSIS — Z79899 Other long term (current) drug therapy: Secondary | ICD-10-CM | POA: Insufficient documentation

## 2016-10-28 DIAGNOSIS — X79XXXA Intentional self-harm by blunt object, initial encounter: Secondary | ICD-10-CM | POA: Insufficient documentation

## 2016-10-28 DIAGNOSIS — Y999 Unspecified external cause status: Secondary | ICD-10-CM | POA: Insufficient documentation

## 2016-10-28 DIAGNOSIS — S060X0A Concussion without loss of consciousness, initial encounter: Secondary | ICD-10-CM | POA: Diagnosis not present

## 2016-10-28 DIAGNOSIS — I10 Essential (primary) hypertension: Secondary | ICD-10-CM | POA: Diagnosis not present

## 2016-10-28 DIAGNOSIS — S0093XA Contusion of unspecified part of head, initial encounter: Secondary | ICD-10-CM

## 2016-10-28 MED ORDER — ACETAMINOPHEN 500 MG PO TABS: 500.0000 mg | ORAL_TABLET | Freq: Four times a day (QID) | ORAL | 0 refills | Status: DC | PRN

## 2016-10-28 MED ORDER — ACETAMINOPHEN 325 MG PO TABS
650.0000 mg | ORAL_TABLET | Freq: Once | ORAL | Status: AC
  Administered 2016-10-28: 650 mg via ORAL
  Filled 2016-10-28: qty 2

## 2016-10-28 NOTE — Discharge Instructions (Signed)
Do not participate in any sports or any activities that could result in head trauma until you are cleared by your pediatrician,  primary care physician or neurologist.  ° °

## 2016-10-28 NOTE — ED Triage Notes (Signed)
Patient was arrested and started banging his head on police glass once in the car.  In addition, he banded his head on side of car also.   BP: 152/104 HR: R:25  O2: 99% on room air

## 2016-10-28 NOTE — ED Provider Notes (Signed)
[facility]-EMERGENCY DEPT Provider Note   CSN: [id number] Arrival date & time: 10/28/16  1114     History   Chief Complaint Chief Complaint  Patient presents with  . Headache   HPI   Blood pressure 141/85, pulse 97, temperature 98 F (36.7 C), temperature source Oral, resp. rate 14, height 5\' 10"  (1.778 m), weight 86.2 kg, SpO2 98 %.  Danny Dickensarrick Mcclure is a 25 y.o. male complaining of moderate headache, patient was being arrested and she hit his head multiple times against the plexiglass in the police car. When police removed him from the car he hit his head against the outside of the car, they placed him on the grass and he continued to hit the right side of his head against the grass. No loss of consciousness, patient denies neck pain, other muscle skeletal pain, nausea, vomiting, headache or change in vision.  Past Medical History:  Diagnosis Date  . HIV (human immunodeficiency virus infection) (HCC)   . Hypertension     There are no active problems to display for this patient.   Past Surgical History:  Procedure Laterality Date  . FRACTURE SURGERY     left leg fx w/ rod placement  . TONSILLECTOMY         Home Medications    Prior to Admission medications   Medication Sig Start Date End Date Taking? Authorizing Provider  abacavir-dolutegravir-lamiVUDine (TRIUMEQ) 600-50-300 MG tablet Take 1 tablet by mouth daily.     Historical Provider, MD  acetaminophen (TYLENOL) 500 MG tablet Take 1 tablet (500 mg total) by mouth every 6 (six) hours as needed. 10/28/16   Thalia Turkington, PA-C  albuterol (PROVENTIL HFA;VENTOLIN HFA) 108 (90 BASE) MCG/ACT inhaler Inhale 1 puff into the lungs every 6 (six) hours as needed for wheezing or shortness of breath.    Historical Provider, MD  benzonatate (TESSALON) 100 MG capsule Take 1 capsule (100 mg total) by mouth every 8 (eight) hours. 09/08/16   Rise MuKenneth T Leaphart, PA-C  butalbital-acetaminophen-caffeine (FIORICET WITH CODEINE) [phone] MG  capsule Take 1 capsule by mouth every 4 (four) hours as needed for headache.    Historical Provider, MD  Diphenhydramine-Phenylephrine (THERAFLU COLD/COUGH NIGHTTIME PO) Take 2 tablets by mouth daily as needed (cold symptoms).    Historical Provider, MD  fluticasone (FLONASE) 50 MCG/ACT nasal spray Place 2 sprays into both nostrils daily as needed for allergies.     Historical Provider, MD  ibuprofen (ADVIL,MOTRIN) 200 MG tablet Take 400 mg by mouth every 6 (six) hours as needed for fever or headache.    Historical Provider, MD  ibuprofen (ADVIL,MOTRIN) 600 MG tablet Take 1 tablet (600 mg total) by mouth every 6 (six) hours as needed. Patient not taking: Reported on 04/28/2016 08/13/15   Lyndal Pulleyaniel Knott, MD  lisinopril (PRINIVIL,ZESTRIL) 20 MG tablet Take 1 tablet (20 mg total) by mouth daily. Patient taking differently: Take 10 mg by mouth daily.  11/29/15   Mady GemmaElizabeth C Westfall, PA-C  ondansetron (ZOFRAN ODT) 4 MG disintegrating tablet Take 1 tablet (4 mg total) by mouth every 8 (eight) hours as needed for nausea or vomiting. Patient not taking: Reported on 09/08/2016 04/29/16   Elpidio AnisShari Upstill, PA-C  Phenylephrine-APAP-Guaifenesin (MUCINEX FAST-MAX) [phone] MG/20ML LIQD Take 1 Dose by mouth daily as needed (cold sypmtoms).    Historical Provider, MD    Family History No family history on file.  Social History Social History  Substance Use Topics  . Smoking status: Never Smoker  . Smokeless tobacco: Never Used  .  Alcohol use No     Allergies   Penicillins   Review of Systems Review of Systems  10 systems reviewed and found to be negative, except as noted in the HPI.   Physical Exam Updated Vital Signs BP 141/85   Pulse 97   Temp 98 F (36.7 C) (Oral)   Resp 14   Ht 5\' 10"  (1.778 m)   Wt 86.2 kg   SpO2 98%   BMI 27.26 kg/m   Physical Exam  Constitutional: He is oriented to person, place, and time. He appears well-developed and well-nourished. No distress.  HENT:  Head:  Normocephalic and atraumatic.  Mouth/Throat: Oropharynx is clear and moist.  Eyes: Conjunctivae and EOM are normal. Pupils are equal, round, and reactive to light.  No TTP of maxillary or frontal sinuses  No TTP or induration of temporal arteries bilaterally  Neck: Normal range of motion. Neck supple.  FROM to C-spine. Pt can touch chin to chest without discomfort. No TTP of midline cervical spine.   Cardiovascular: Normal rate, regular rhythm and intact distal pulses.   Pulmonary/Chest: Effort normal and breath sounds normal. No respiratory distress. He has no wheezes. He has no rales. He exhibits no tenderness.  Abdominal: Soft. Bowel sounds are normal. There is no tenderness.  Musculoskeletal: Normal range of motion. He exhibits no edema or tenderness.  Neurological: He is alert and oriented to person, place, and time. No cranial nerve deficit.  II-Visual fields grossly intact. III/IV/VI-Extraocular movements intact.  Pupils reactive bilaterally. V/VII-Smile symmetric, equal eyebrow raise,  facial sensation intact VIII- Hearing grossly intact IX/X-Normal gag XI-bilateral shoulder shrug XII-midline tongue extension Motor: 5/5 bilaterally with normal tone and bulk Cerebellar: Normal finger-to-nose  and normal heel-to-shin test.   Romberg negative Ambulates with a coordinated gait   Skin: He is not diaphoretic.  Psychiatric: He has a normal mood and affect.  Nursing note and vitals reviewed.    ED Treatments / Results  Labs (all labs ordered are listed, but only abnormal results are displayed) Labs Reviewed - No data to display  EKG  EKG Interpretation None       Radiology Ct Head Wo Contrast  Result Date: 10/28/2016 CLINICAL DATA:  Self-inflicted blow to the head on a car window today. Initial encounter. EXAM: CT HEAD WITHOUT CONTRAST TECHNIQUE: Contiguous axial images were obtained from the base of the skull through the vertex without intravenous contrast.  COMPARISON:  None. FINDINGS: Brain: Appears normal without hemorrhage, infarct, mass lesion, mass effect, midline shift or abnormal extra-axial fluid collection. No hydrocephalus or pneumocephalus. Vascular: Negative. Skull: Intact. Sinuses/Orbits: Negative. Other: None. IMPRESSION: Negative exam. Electronically Signed   By: Drusilla Kanner M.D.   On: 10/28/2016 12:14    Procedures Procedures (including critical care time)  Medications Ordered in ED Medications  acetaminophen (TYLENOL) tablet 650 mg (650 mg Oral Given 10/28/16 1201)     Initial Impression / Assessment and Plan / ED Course  I have reviewed the triage vital signs and the nursing notes.  Pertinent labs & imaging results that were available during my care of the patient were reviewed by me and considered in my medical decision making (see chart for details).  Clinical Course     Vitals:   10/28/16 1125 10/28/16 1127 10/28/16 1130  BP:  138/96 141/85  Pulse:  94 97  Resp:  11 14  Temp:  98 F (36.7 C)   TempSrc:  Oral   SpO2:  98% 98%  Weight: 86.2 kg  Height: 5\' 10"  (1.778 m)      Medications  acetaminophen (TYLENOL) tablet 650 mg (650 mg Oral Given 10/28/16 1201)    Danny Camacho is 25 y.o. male presenting with  Head trauma, he banged his head up against the plexi glass and car and on the floor after being arrested. Neurologic exam is nonfocal. CT without abnormality, patient contracts for safety. Tylenol for pain control, concussion precautions discussed.  Evaluation does not show pathology that would require ongoing emergent intervention or inpatient treatment. Pt is hemodynamically stable and mentating appropriately. Discussed findings and plan with patient/guardian, who agrees with care plan. All questions answered. Return precautions discussed and outpatient follow up given.   Final Clinical Impressions(s) / ED Diagnoses   Final diagnoses:  Contusion of head, unspecified part of head, initial encounter   Concussion without loss of consciousness, initial encounter    New Prescriptions New Prescriptions   ACETAMINOPHEN (TYLENOL) 500 MG TABLET    Take 1 tablet (500 mg total) by mouth every 6 (six) hours as needed.     Wynetta Emeryicole Yoceline Bazar, PA-C 10/28/16 1230    Lorre NickAnthony Allen, MD 10/30/16 [phone]

## 2016-10-28 NOTE — ED Notes (Signed)
Bed: ZO10WA19 Expected date:  Expected time:  Means of arrival:  Comments: 25 yo [employer] custody-eval for head injury; banging head on window of police car

## 2017-03-28 ENCOUNTER — Encounter (HOSPITAL_COMMUNITY): Payer: Self-pay | Admitting: Emergency Medicine

## 2017-03-28 ENCOUNTER — Emergency Department (HOSPITAL_COMMUNITY)
Admission: EM | Admit: 2017-03-28 | Discharge: 2017-03-28 | Disposition: A | Discharge status: Home / Self Care | Payer: Non-veteran care | Source: Home / Self Care | Attending: Emergency Medicine | Admitting: Emergency Medicine

## 2017-03-28 ENCOUNTER — Emergency Department (HOSPITAL_COMMUNITY): Payer: Non-veteran care

## 2017-03-28 DIAGNOSIS — K594 Anal spasm: Secondary | ICD-10-CM

## 2017-03-28 DIAGNOSIS — I10 Essential (primary) hypertension: Secondary | ICD-10-CM

## 2017-03-28 DIAGNOSIS — R200 Anesthesia of skin: Secondary | ICD-10-CM | POA: Insufficient documentation

## 2017-03-28 DIAGNOSIS — R202 Paresthesia of skin: Secondary | ICD-10-CM

## 2017-03-28 DIAGNOSIS — Z79899 Other long term (current) drug therapy: Secondary | ICD-10-CM

## 2017-03-28 DIAGNOSIS — K6289 Other specified diseases of anus and rectum: Secondary | ICD-10-CM

## 2017-03-28 LAB — CBC WITH DIFFERENTIAL/PLATELET
BASOS ABS: 0 10*3/uL (ref 0.0–0.1)
BASOS PCT: 0 %
EOS ABS: 0.3 10*3/uL (ref 0.0–0.7)
EOS PCT: 3 %
HCT: 45.2 % (ref 39.0–52.0)
Hemoglobin: 15.6 g/dL (ref 13.0–17.0)
Lymphocytes Relative: 17 %
Lymphs Abs: 1.6 10*3/uL (ref 0.7–4.0)
MCH: 29.9 pg (ref 26.0–34.0)
MCHC: 34.5 g/dL (ref 30.0–36.0)
MCV: 86.6 fL (ref 78.0–100.0)
MONO ABS: 1.2 10*3/uL — AB (ref 0.1–1.0)
MONOS PCT: 12 %
NEUTROS ABS: 6.6 10*3/uL (ref 1.7–7.7)
Neutrophils Relative %: 68 %
PLATELETS: 202 10*3/uL (ref 150–400)
RBC: 5.22 MIL/uL (ref 4.22–5.81)
RDW: 12.5 % (ref 11.5–15.5)
WBC: 9.7 10*3/uL (ref 4.0–10.5)

## 2017-03-28 LAB — COMPREHENSIVE METABOLIC PANEL
ALBUMIN: 4 g/dL (ref 3.5–5.0)
ALT: 18 U/L (ref 17–63)
ANION GAP: 10 (ref 5–15)
AST: 21 U/L (ref 15–41)
Alkaline Phosphatase: 70 U/L (ref 38–126)
BUN: 11 mg/dL (ref 6–20)
CALCIUM: 9 mg/dL (ref 8.9–10.3)
CHLORIDE: 107 mmol/L (ref 101–111)
CO2: 22 mmol/L (ref 22–32)
Creatinine, Ser: 1.28 mg/dL — ABNORMAL HIGH (ref 0.61–1.24)
GFR calc Af Amer: >60 mL/min (ref >60)
GFR calc non Af Amer: >60 mL/min (ref >60)
GLUCOSE: 122 mg/dL — AB (ref 65–99)
POTASSIUM: 4 mmol/L (ref 3.5–5.1)
SODIUM: 139 mmol/L (ref 135–145)
Total Bilirubin: 0.9 mg/dL (ref 0.3–1.2)
Total Protein: 6.8 g/dL (ref 6.5–8.1)

## 2017-03-28 LAB — LIPASE, BLOOD: LIPASE: 23 U/L (ref 11–51)

## 2017-03-28 MED ORDER — ONDANSETRON HCL 4 MG/2ML IJ SOLN
4.0000 mg | Freq: Once | INTRAMUSCULAR | Status: AC
  Administered 2017-03-28: 4 mg via INTRAVENOUS
  Filled 2017-03-28: qty 2

## 2017-03-28 MED ORDER — HYDROCORTISONE 2.5 % RE CREA: TOPICAL_CREAM | RECTAL | 0 refills | Status: DC

## 2017-03-28 MED ORDER — TRAMADOL HCL 50 MG PO TABS: 50.0000 mg | ORAL_TABLET | Freq: Two times a day (BID) | ORAL | 0 refills | Status: DC | PRN

## 2017-03-28 MED ORDER — TRAMADOL HCL 50 MG PO TABS
50.0000 mg | ORAL_TABLET | Freq: Once | ORAL | Status: AC
  Administered 2017-03-28: 50 mg via ORAL
  Filled 2017-03-28: qty 1

## 2017-03-28 MED ORDER — HYDROCORTISONE ACETATE 25 MG RE SUPP
25.0000 mg | Freq: Once | RECTAL | Status: AC
  Administered 2017-03-28: 25 mg via RECTAL
  Filled 2017-03-28: qty 1

## 2017-03-28 MED ORDER — DICYCLOMINE HCL 10 MG PO CAPS
20.0000 mg | ORAL_CAPSULE | Freq: Once | ORAL | Status: AC
Start: 2017-03-28 — End: 2017-03-28
  Administered 2017-03-28: 20 mg via ORAL
  Filled 2017-03-28: qty 2

## 2017-03-28 MED ORDER — FENTANYL CITRATE (PF) 100 MCG/2ML IJ SOLN
100.0000 ug | Freq: Once | INTRAMUSCULAR | Status: AC
  Administered 2017-03-28: 100 ug via INTRAVENOUS
  Filled 2017-03-28: qty 2

## 2017-03-28 MED ORDER — FENTANYL CITRATE (PF) 100 MCG/2ML IJ SOLN
50.0000 ug | Freq: Once | INTRAMUSCULAR | Status: AC
  Administered 2017-03-28: 50 ug via INTRAVENOUS
  Filled 2017-03-28: qty 2

## 2017-03-28 MED ORDER — ONDANSETRON 4 MG PO TBDP: 4.0000 mg | ORAL_TABLET | Freq: Three times a day (TID) | ORAL | 0 refills | Status: DC | PRN

## 2017-03-28 NOTE — ED Provider Notes (Signed)
[facility]-EMERGENCY DEPT Provider Note   CSN: [id number] Arrival date & time: 03/28/17  0428     History   Chief Complaint Chief Complaint  Patient presents with  . Rectal Pain  . Right Foot Numb    HPI Danny Camacho is a 26 y.o. male with PMH of HIV, here with rectal pain. He states he had anal sex for the first time 2 weeks ago.  He then started to have pain 2 days ago in the rectal area in a spastic nature.  His BM have been coming less often as well. He has nausea but no vomiting.  He has a PCP appt for this, this morning at 10am. He has tried ibuprofen, aspirin and good powder without any relief.  Finally patient also complains of numbness on the top of his right foot. This all started same time. There are no further complaints.  10 Systems reviewed and are negative for acute change except as noted in the HPI.   HPI  Past Medical History:  Diagnosis Date  . HIV (human immunodeficiency virus infection) (HCC)   . Hypertension     There are no active problems to display for this patient.   Past Surgical History:  Procedure Laterality Date  . FRACTURE SURGERY     left leg fx w/ rod placement  . TONSILLECTOMY         Home Medications    Prior to Admission medications   Medication Sig Start Date End Date Taking? Authorizing Provider  abacavir-dolutegravir-lamiVUDine (TRIUMEQ) 600-50-300 MG tablet Take 1 tablet by mouth daily.     Historical Provider, MD  acetaminophen (TYLENOL) 500 MG tablet Take 1 tablet (500 mg total) by mouth every 6 (six) hours as needed. 10/28/16   Nicole Pisciotta, PA-C  albuterol (PROVENTIL HFA;VENTOLIN HFA) 108 (90 BASE) MCG/ACT inhaler Inhale 1 puff into the lungs every 6 (six) hours as needed for wheezing or shortness of breath.    Historical Provider, MD  benzonatate (TESSALON) 100 MG capsule Take 1 capsule (100 mg total) by mouth every 8 (eight) hours. 09/08/16   Rise Mu, PA-C  butalbital-acetaminophen-caffeine (FIORICET WITH CODEINE)  [phone] MG capsule Take 1 capsule by mouth every 4 (four) hours as needed for headache.    Historical Provider, MD  Diphenhydramine-Phenylephrine (THERAFLU COLD/COUGH NIGHTTIME PO) Take 2 tablets by mouth daily as needed (cold symptoms).    Historical Provider, MD  fluticasone (FLONASE) 50 MCG/ACT nasal spray Place 2 sprays into both nostrils daily as needed for allergies.     Historical Provider, MD  ibuprofen (ADVIL,MOTRIN) 200 MG tablet Take 400 mg by mouth every 6 (six) hours as needed for fever or headache.    Historical Provider, MD  ibuprofen (ADVIL,MOTRIN) 600 MG tablet Take 1 tablet (600 mg total) by mouth every 6 (six) hours as needed. Patient not taking: Reported on 04/28/2016 08/13/15   Lyndal Pulley, MD  lisinopril (PRINIVIL,ZESTRIL) 20 MG tablet Take 1 tablet (20 mg total) by mouth daily. Patient taking differently: Take 10 mg by mouth daily.  11/29/15   Mady Gemma, PA-C  ondansetron (ZOFRAN ODT) 4 MG disintegrating tablet Take 1 tablet (4 mg total) by mouth every 8 (eight) hours as needed for nausea or vomiting. Patient not taking: Reported on 09/08/2016 04/29/16   Elpidio Anis, PA-C  Phenylephrine-APAP-Guaifenesin (MUCINEX FAST-MAX) [phone] MG/20ML LIQD Take 1 Dose by mouth daily as needed (cold sypmtoms).    Historical Provider, MD  traMADol (ULTRAM) 50 MG tablet Take 1 tablet (50 mg  total) by mouth every 12 (twelve) hours as needed. 03/28/17   Tomasita Crumble, MD    Family History No family history on file.  Social History Social History  Substance Use Topics  . Smoking status: Never Smoker  . Smokeless tobacco: Never Used  . Alcohol use No     Allergies   Penicillins   Review of Systems Review of Systems   Physical Exam Updated Vital Signs BP (!) 147/106 (BP Location: Left Arm)   Pulse 95   Temp 98.5 F (36.9 C)   Resp 16   Ht  (1.778 m)   Wt 197 lb (89.4 kg)   SpO2 97%   BMI 28.27 kg/m   Physical Exam  Constitutional: He is oriented to  person, place, and time. Vital signs are normal. He appears well-developed and well-nourished.  Non-toxic appearance. He does not appear ill. No distress.  HENT:  Head: Normocephalic and atraumatic.  Nose: Nose normal.  Mouth/Throat: Oropharynx is clear and moist. No oropharyngeal exudate.  Eyes: Conjunctivae and EOM are normal. Pupils are equal, round, and reactive to light. No scleral icterus.  Neck: Normal range of motion. Neck supple. No tracheal deviation, no edema, no erythema and normal range of motion present. No thyroid mass and no thyromegaly present.  Cardiovascular: Normal rate, regular rhythm, S1 normal, S2 normal, normal heart sounds, intact distal pulses and normal pulses.  Exam reveals no gallop and no friction rub.   No murmur heard. Pulmonary/Chest: Effort normal and breath sounds normal. No respiratory distress. He has no wheezes. He has no rhonchi. He has no rales.  Abdominal: Soft. Normal appearance and bowel sounds are normal. He exhibits no distension, no ascites and no mass. There is no hepatosplenomegaly. There is no tenderness. There is no rebound, no guarding and no CVA tenderness.  Genitourinary: Rectum normal and prostate normal.  Musculoskeletal: Normal range of motion. He exhibits no edema or tenderness.  Lymphadenopathy:    He has no cervical adenopathy.  Neurological: He is alert and oriented to person, place, and time. He has normal strength. No cranial nerve deficit or sensory deficit.  Numbness to the dorsum on the R foot  Skin: Skin is warm, dry and intact. No petechiae and no rash noted. He is not diaphoretic. No erythema. No pallor.  Nursing note and vitals reviewed.    ED Treatments / Results  Labs (all labs ordered are listed, but only abnormal results are displayed) Labs Reviewed  CBC WITH DIFFERENTIAL/PLATELET - Abnormal; Notable for the following:       Result Value   Monocytes Absolute 1.2 (*)    All other components within normal limits    COMPREHENSIVE METABOLIC PANEL - Abnormal; Notable for the following:    Glucose, Bld 122 (*)    Creatinine, Ser 1.28 (*)    All other components within normal limits  LIPASE, BLOOD    EKG  EKG Interpretation None       Radiology Dg Abd 2 Views  Result Date: 03/28/2017 CLINICAL DATA:  Recent anal sex. Abdominal pain and nausea for 3 days. EXAM: ABDOMEN - 2 VIEW COMPARISON:  Chest 09/08/2016 FINDINGS: Gas and stool throughout the colon. No small or large bowel distention. No free intra- abdominal air. No abnormal air-fluid levels. No radiopaque stones. Visualized bones appear intact. Soft tissue contours are unremarkable. IMPRESSION: Nonobstructive bowel gas pattern. Electronically Signed   By: Burman Nieves M.D.   On: 03/28/2017 05:36    Procedures Procedures (including critical care  time)  Medications Ordered in ED Medications  fentaNYL (SUBLIMAZE) injection 100 mcg (not administered)  fentaNYL (SUBLIMAZE) injection 50 mcg (50 mcg Intravenous Given 03/28/17 0506)  ondansetron (ZOFRAN) injection 4 mg (4 mg Intravenous Given 03/28/17 0506)  dicyclomine (BENTYL) capsule 20 mg (20 mg Oral Given 03/28/17 0505)     Initial Impression / Assessment and Plan / ED Course  I have reviewed the triage vital signs and the nursing notes.  Pertinent labs & imaging results that were available during my care of the patient were reviewed by me and considered in my medical decision making (see chart for details).       FPatient presents emergency department for rectal pain, nausea, and right foot numbness. This is likely he has proctitis ago. He was advised on sitz bath, tramadol for severe pain. X-ray is negative for any foreign body or free air. He was given Zofran which completely relieved his nausea. Sentinel controlled his pain. His has an appt with his primary care physician this morning at 10 AM. The numbness may be related to nerve damage from the anal sex. He is educated on this as  well. He appears well in no acute distress, vital signs were within his normal limits, labs unremarkable, patient is safe for discharge.inal Clinical Impressions(s) / ED Diagnoses   Final diagnoses:  Proctalgia fugax    New Prescriptions New Prescriptions   TRAMADOL (ULTRAM) 50 MG TABLET    Take 1 tablet (50 mg total) by mouth every 12 (twelve) hours as needed.     Tomasita Crumble, MD 03/28/17 [phone]

## 2017-03-28 NOTE — ED Triage Notes (Signed)
Patient reports rectal pain and right foot numbness for 2 days , denies injury or bleeding , ambulatory , no fever or chills .

## 2017-03-28 NOTE — ED Provider Notes (Signed)
[facility]-EMERGENCY DEPT Provider Note   CSN: [id number] Arrival date & time: 03/28/17  1933     History   Chief Complaint Chief Complaint  Patient presents with  . Chest Pain  . Rectal Pain    HPI Danny Camacho is a 26 y.o. male.  This is a 26 year old male with a history of HIV who was seen earlier in the day for rectal pain and chest discomfort. His labs, x-ray and EKG at that time were all within normal parameters.  He was prescribed Ultram for his pain.  He states he took one when he went home.  He was then seen at the [facility] by his primary care physician, who draw additional labs did not give him any additional pain control or topical ointments for his rectal pain and he has a follow-up appointment in a week.  He returns today for continued pain not relieved by one dose of Ultram and now is complaining of tingling in the bilateral feet, the left on the sole of his foot and on the right at the base of the toes to the tip of the toes.      Past Medical History:  Diagnosis Date  . HIV (human immunodeficiency virus infection) (HCC)   . Hypertension     There are no active problems to display for this patient.   Past Surgical History:  Procedure Laterality Date  . FRACTURE SURGERY     left leg fx w/ rod placement  . TONSILLECTOMY         Home Medications    Prior to Admission medications   Medication Sig Start Date End Date Taking? Authorizing Provider  abacavir-dolutegravir-lamiVUDine (TRIUMEQ) 600-50-300 MG tablet Take 1 tablet by mouth daily.     Historical Provider, MD  acetaminophen (TYLENOL) 500 MG tablet Take 1 tablet (500 mg total) by mouth every 6 (six) hours as needed. 10/28/16   Nicole Pisciotta, PA-C  albuterol (PROVENTIL HFA;VENTOLIN HFA) 108 (90 BASE) MCG/ACT inhaler Inhale 1 puff into the lungs every 6 (six) hours as needed for wheezing or shortness of breath.    Historical Provider, MD  benzonatate (TESSALON) 100 MG capsule Take 1 capsule (100 mg total) by  mouth every 8 (eight) hours. 09/08/16   Rise Mu, PA-C  butalbital-acetaminophen-caffeine (FIORICET WITH CODEINE) [phone] MG capsule Take 1 capsule by mouth every 4 (four) hours as needed for headache.    Historical Provider, MD  Diphenhydramine-Phenylephrine (THERAFLU COLD/COUGH NIGHTTIME PO) Take 2 tablets by mouth daily as needed (cold symptoms).    Historical Provider, MD  fluticasone (FLONASE) 50 MCG/ACT nasal spray Place 2 sprays into both nostrils daily as needed for allergies.     Historical Provider, MD  hydrocortisone (ANUSOL-HC) 2.5 % rectal cream Apply rectally 2 times daily 03/28/17   Earley Favor, NP  ibuprofen (ADVIL,MOTRIN) 200 MG tablet Take 400 mg by mouth every 6 (six) hours as needed for fever or headache.    Historical Provider, MD  ibuprofen (ADVIL,MOTRIN) 600 MG tablet Take 1 tablet (600 mg total) by mouth every 6 (six) hours as needed. Patient not taking: Reported on 04/28/2016 08/13/15   Lyndal Pulley, MD  lisinopril (PRINIVIL,ZESTRIL) 20 MG tablet Take 1 tablet (20 mg total) by mouth daily. Patient taking differently: Take 10 mg by mouth daily.  11/29/15   Mady Gemma, PA-C  ondansetron (ZOFRAN ODT) 4 MG disintegrating tablet Take 1 tablet (4 mg total) by mouth every 8 (eight) hours as needed for nausea or vomiting. Patient not  taking: Reported on 09/08/2016 04/29/16   Elpidio Anis, PA-C  ondansetron (ZOFRAN ODT) 4 MG disintegrating tablet Take 1 tablet (4 mg total) by mouth every 8 (eight) hours as needed for nausea or vomiting. 03/28/17   Earley Favor, NP  Phenylephrine-APAP-Guaifenesin (MUCINEX FAST-MAX) [phone] MG/20ML LIQD Take 1 Dose by mouth daily as needed (cold sypmtoms).    Historical Provider, MD  traMADol (ULTRAM) 50 MG tablet Take 1 tablet (50 mg total) by mouth every 12 (twelve) hours as needed. 03/28/17   Tomasita Crumble, MD    Family History No family history on file.  Social History Social History  Substance Use Topics  . Smoking status:  Never Smoker  . Smokeless tobacco: Never Used  . Alcohol use No     Allergies   Penicillins   Review of Systems Review of Systems  Constitutional: Negative for fever.  Gastrointestinal: Positive for nausea and rectal pain. Negative for blood in stool, constipation and diarrhea.  Genitourinary: Negative for dysuria.  Neurological: Positive for numbness.  All other systems reviewed and are negative.    Physical Exam Updated Vital Signs BP 133/77   Pulse 72   Temp 98.3 F (36.8 C) (Oral)   Resp 14   SpO2 98%   Physical Exam  Constitutional: He appears well-developed and well-nourished.  HENT:  Mouth/Throat: Oropharynx is clear and moist.  Eyes: Pupils are equal, round, and reactive to light.  Neck: Normal range of motion.  Cardiovascular: Normal rate.   Pulmonary/Chest: Effort normal.  Genitourinary: Rectal exam shows tenderness. Rectal exam shows no external hemorrhoid and no internal hemorrhoid.  Musculoskeletal: He exhibits tenderness.       Left foot: There is tenderness.       Feet:  Neurological: He is alert.  Skin: Skin is warm and dry.  Nursing note and vitals reviewed.    ED Treatments / Results  Labs (all labs ordered are listed, but only abnormal results are displayed) Labs Reviewed - No data to display  EKG  EKG Interpretation None       Radiology Dg Abd 2 Views  Result Date: 03/28/2017 CLINICAL DATA:  Recent anal sex. Abdominal pain and nausea for 3 days. EXAM: ABDOMEN - 2 VIEW COMPARISON:  Chest 09/08/2016 FINDINGS: Gas and stool throughout the colon. No small or large bowel distention. No free intra- abdominal air. No abnormal air-fluid levels. No radiopaque stones. Visualized bones appear intact. Soft tissue contours are unremarkable. IMPRESSION: Nonobstructive bowel gas pattern. Electronically Signed   By: Burman Nieves M.D.   On: 03/28/2017 05:36    Procedures Procedures (including critical care time)  Medications Ordered in  ED Medications  hydrocortisone (ANUSOL-HC) suppository 25 mg (not administered)  traMADol (ULTRAM) tablet 50 mg (50 mg Oral Given 03/28/17 2026)     Initial Impression / Assessment and Plan / ED Course  I have reviewed the triage vital signs and the nursing notes.  Pertinent labs & imaging results that were available during my care of the patient were reviewed by me and considered in my medical decision making (see chart for details).      Given Anusol topical ointment increased his tramadol to every 6-8 hours alternating with Tylenol.  He is to call his PCP at the [facility] to request a neurology appointment on the same day to assess for the numbness and tingling in his feet. He was given a prescription for Zofran to help control nausea  Final Clinical Impressions(s) / ED Diagnoses   Final diagnoses:  Rectal pain  Numbness and tingling of both feet    New Prescriptions New Prescriptions   HYDROCORTISONE (ANUSOL-HC) 2.5 % RECTAL CREAM    Apply rectally 2 times daily   ONDANSETRON (ZOFRAN ODT) 4 MG DISINTEGRATING TABLET    Take 1 tablet (4 mg total) by mouth every 8 (eight) hours as needed for nausea or vomiting.     Earley Favor, NP 03/28/17 1610    Lorre Nick, MD 03/28/17 [phone]

## 2017-03-28 NOTE — ED Notes (Signed)
Pt reporting attempting anal intercourse weeks ago with residual rectal pain and right lateral foot numbness

## 2017-03-28 NOTE — Discharge Instructions (Signed)
Start using Preparation H to the anal area  You can take the Prescribed Ultram every 6-8 hours for pain alternating with Tylenol. Call your PCP at the [facility] and ask them to set up a Neurology appointment on the same day.

## 2017-03-28 NOTE — ED Triage Notes (Addendum)
Per EMS pt was evaluated here this morning for the same symptoms. Filled his prescription for Tramadol and took that about 1130 today but doesn't feel better and feels he may be worse.  10/10 rectal and bilateral foot pain.  Chest pressure.  NAD.  A&O x 4.  Pt states he " takes his HIV meds as prescribed and doesn't know why he is feeling bad."  Pt had 324 ASA and 4 mg Zofran IV en route

## 2017-03-29 ENCOUNTER — Encounter (HOSPITAL_COMMUNITY): Payer: Self-pay

## 2017-03-29 ENCOUNTER — Emergency Department (HOSPITAL_COMMUNITY): Payer: Self-pay

## 2017-03-29 ENCOUNTER — Observation Stay (HOSPITAL_COMMUNITY): Payer: Self-pay | Admitting: Anesthesiology

## 2017-03-29 ENCOUNTER — Encounter (HOSPITAL_COMMUNITY): Admission: EM | Disposition: A | Discharge status: Home / Self Care | Payer: Self-pay | Source: Home / Self Care

## 2017-03-29 ENCOUNTER — Inpatient Hospital Stay (HOSPITAL_COMMUNITY)
Admission: EM | Admit: 2017-03-29 | Discharge: 2017-04-02 | DRG: 166 | Disposition: A | Discharge status: Home / Self Care | Payer: Self-pay | Attending: Surgery | Admitting: Surgery

## 2017-03-29 DIAGNOSIS — I10 Essential (primary) hypertension: Secondary | ICD-10-CM | POA: Diagnosis present

## 2017-03-29 DIAGNOSIS — J189 Pneumonia, unspecified organism: Principal | ICD-10-CM | POA: Diagnosis present

## 2017-03-29 DIAGNOSIS — Z880 Allergy status to penicillin: Secondary | ICD-10-CM

## 2017-03-29 DIAGNOSIS — R590 Localized enlarged lymph nodes: Secondary | ICD-10-CM | POA: Diagnosis present

## 2017-03-29 DIAGNOSIS — B950 Streptococcus, group A, as the cause of diseases classified elsewhere: Secondary | ICD-10-CM | POA: Diagnosis present

## 2017-03-29 DIAGNOSIS — R042 Hemoptysis: Secondary | ICD-10-CM

## 2017-03-29 DIAGNOSIS — J385 Laryngeal spasm: Secondary | ICD-10-CM | POA: Diagnosis present

## 2017-03-29 DIAGNOSIS — J9601 Acute respiratory failure with hypoxia: Secondary | ICD-10-CM

## 2017-03-29 DIAGNOSIS — R319 Hematuria, unspecified: Secondary | ICD-10-CM | POA: Diagnosis present

## 2017-03-29 DIAGNOSIS — Z21 Asymptomatic human immunodeficiency virus [HIV] infection status: Secondary | ICD-10-CM | POA: Diagnosis present

## 2017-03-29 DIAGNOSIS — Z79899 Other long term (current) drug therapy: Secondary | ICD-10-CM

## 2017-03-29 DIAGNOSIS — R7981 Abnormal blood-gas level: Secondary | ICD-10-CM

## 2017-03-29 DIAGNOSIS — B20 Human immunodeficiency virus [HIV] disease: Secondary | ICD-10-CM | POA: Diagnosis present

## 2017-03-29 DIAGNOSIS — K6289 Other specified diseases of anus and rectum: Secondary | ICD-10-CM

## 2017-03-29 DIAGNOSIS — R918 Other nonspecific abnormal finding of lung field: Secondary | ICD-10-CM

## 2017-03-29 DIAGNOSIS — G629 Polyneuropathy, unspecified: Secondary | ICD-10-CM | POA: Diagnosis present

## 2017-03-29 DIAGNOSIS — K610 Anal abscess: Secondary | ICD-10-CM

## 2017-03-29 DIAGNOSIS — E860 Dehydration: Secondary | ICD-10-CM | POA: Diagnosis present

## 2017-03-29 DIAGNOSIS — K612 Anorectal abscess: Secondary | ICD-10-CM | POA: Diagnosis present

## 2017-03-29 DIAGNOSIS — K611 Rectal abscess: Secondary | ICD-10-CM | POA: Diagnosis present

## 2017-03-29 HISTORY — PX: INCISION AND DRAINAGE PERIRECTAL ABSCESS: SHX1804

## 2017-03-29 LAB — URINALYSIS, ROUTINE W REFLEX MICROSCOPIC
BACTERIA UA: NONE SEEN
Bilirubin Urine: NEGATIVE
Glucose, UA: NEGATIVE mg/dL
Ketones, ur: 80 mg/dL — AB
Nitrite: NEGATIVE
Protein, ur: 30 mg/dL — AB
SPECIFIC GRAVITY, URINE: 1.033 — AB (ref 1.005–1.030)
pH: 5 (ref 5.0–8.0)

## 2017-03-29 LAB — CBC WITH DIFFERENTIAL/PLATELET
BASOS PCT: 0 %
Basophils Absolute: 0 10*3/uL (ref 0.0–0.1)
Eosinophils Absolute: 0.1 10*3/uL (ref 0.0–0.7)
Eosinophils Relative: 0 %
HEMATOCRIT: 45.4 % (ref 39.0–52.0)
HEMOGLOBIN: 15.9 g/dL (ref 13.0–17.0)
LYMPHS PCT: 9 %
Lymphs Abs: 1.3 10*3/uL (ref 0.7–4.0)
MCH: 30.3 pg (ref 26.0–34.0)
MCHC: 35 g/dL (ref 30.0–36.0)
MCV: 86.6 fL (ref 78.0–100.0)
MONO ABS: 1.6 10*3/uL — AB (ref 0.1–1.0)
MONOS PCT: 11 %
NEUTROS ABS: 11.9 10*3/uL — AB (ref 1.7–7.7)
NEUTROS PCT: 80 %
Platelets: 202 10*3/uL (ref 150–400)
RBC: 5.24 MIL/uL (ref 4.22–5.81)
RDW: 12.4 % (ref 11.5–15.5)
WBC: 14.9 10*3/uL — ABNORMAL HIGH (ref 4.0–10.5)

## 2017-03-29 LAB — COMPREHENSIVE METABOLIC PANEL
ALBUMIN: 4.3 g/dL (ref 3.5–5.0)
ALK PHOS: 81 U/L (ref 38–126)
ALT: 18 U/L (ref 17–63)
ANION GAP: 9 (ref 5–15)
AST: 18 U/L (ref 15–41)
BILIRUBIN TOTAL: 1.6 mg/dL — AB (ref 0.3–1.2)
BUN: 12 mg/dL (ref 6–20)
CALCIUM: 9.3 mg/dL (ref 8.9–10.3)
CO2: 24 mmol/L (ref 22–32)
Chloride: 105 mmol/L (ref 101–111)
Creatinine, Ser: 1.16 mg/dL (ref 0.61–1.24)
GFR calc Af Amer: >60 mL/min (ref >60)
GLUCOSE: 102 mg/dL — AB (ref 65–99)
Potassium: 3.8 mmol/L (ref 3.5–5.1)
Sodium: 138 mmol/L (ref 135–145)
TOTAL PROTEIN: 8.1 g/dL (ref 6.5–8.1)

## 2017-03-29 LAB — POC OCCULT BLOOD, ED: FECAL OCCULT BLD: NEGATIVE

## 2017-03-29 LAB — SURGICAL PCR SCREEN
MRSA, PCR: NEGATIVE
STAPHYLOCOCCUS AUREUS: NEGATIVE

## 2017-03-29 LAB — LIPASE, BLOOD: LIPASE: 13 U/L (ref 11–51)

## 2017-03-29 SURGERY — INCISION AND DRAINAGE, ABSCESS, PERIRECTAL
Anesthesia: General | Site: Rectum

## 2017-03-29 MED ORDER — DIPHENHYDRAMINE HCL 25 MG PO CAPS: 25.0000 mg | ORAL_CAPSULE | Freq: Four times a day (QID) | ORAL | Status: DC | PRN

## 2017-03-29 MED ORDER — ACETAMINOPHEN 500 MG PO TABS: 500.0000 mg | ORAL_TABLET | Freq: Four times a day (QID) | ORAL | Status: DC | PRN

## 2017-03-29 MED ORDER — 0.9 % SODIUM CHLORIDE (POUR BTL) OPTIME
TOPICAL | Status: DC | PRN
  Administered 2017-03-29: 1000 mL

## 2017-03-29 MED ORDER — HYDROCORTISONE ACE-PRAMOXINE 2.5-1 % RE CREA
1.0000 | TOPICAL_CREAM | Freq: Four times a day (QID) | RECTAL | Status: DC | PRN
Start: 2017-03-29 — End: 2017-04-02
  Filled 2017-03-29: qty 30

## 2017-03-29 MED ORDER — ONDANSETRON HCL 4 MG/2ML IJ SOLN
INTRAMUSCULAR | Status: AC
  Filled 2017-03-29: qty 2

## 2017-03-29 MED ORDER — DIPHENHYDRAMINE HCL 50 MG/ML IJ SOLN: 25.0000 mg | INTRAMUSCULAR | Status: DC | PRN

## 2017-03-29 MED ORDER — CLINDAMYCIN PHOSPHATE 900 MG/50ML IV SOLN
900.0000 mg | INTRAVENOUS | Status: DC
  Filled 2017-03-29: qty 50

## 2017-03-29 MED ORDER — LORATADINE 10 MG PO TABS
10.0000 mg | ORAL_TABLET | Freq: Every day | ORAL | Status: DC
Start: 2017-03-30 — End: 2017-04-02
  Administered 2017-03-30 – 2017-04-02 (×4): 10 mg via ORAL
  Filled 2017-03-29 (×4): qty 1

## 2017-03-29 MED ORDER — SODIUM CHLORIDE 0.9 % IV BOLUS (SEPSIS)
1000.0000 mL | Freq: Once | INTRAVENOUS | Status: AC
  Administered 2017-03-29: 1000 mL via INTRAVENOUS

## 2017-03-29 MED ORDER — MIDAZOLAM HCL 2 MG/2ML IJ SOLN
INTRAMUSCULAR | Status: AC
  Filled 2017-03-29: qty 2

## 2017-03-29 MED ORDER — FENTANYL CITRATE (PF) 250 MCG/5ML IJ SOLN
INTRAMUSCULAR | Status: AC
  Filled 2017-03-29: qty 5

## 2017-03-29 MED ORDER — ONDANSETRON HCL 4 MG/2ML IJ SOLN
4.0000 mg | Freq: Four times a day (QID) | INTRAMUSCULAR | Status: DC | PRN
  Administered 2017-03-29 – 2017-03-31 (×4): 4 mg via INTRAVENOUS
  Filled 2017-03-29 (×5): qty 2

## 2017-03-29 MED ORDER — LABETALOL HCL 5 MG/ML IV SOLN
INTRAVENOUS | Status: AC
  Administered 2017-03-29: 5 mg via INTRAVENOUS
  Filled 2017-03-29: qty 4

## 2017-03-29 MED ORDER — SUCCINYLCHOLINE CHLORIDE 20 MG/ML IJ SOLN
INTRAMUSCULAR | Status: DC | PRN
  Administered 2017-03-29: 60 mg via INTRAVENOUS

## 2017-03-29 MED ORDER — CIPROFLOXACIN IN D5W 400 MG/200ML IV SOLN
INTRAVENOUS | Status: AC
  Filled 2017-03-29: qty 200

## 2017-03-29 MED ORDER — ORAL CARE MOUTH RINSE
15.0000 mL | Freq: Two times a day (BID) | OROMUCOSAL | Status: DC
  Administered 2017-03-31 – 2017-04-01 (×3): 15 mL via OROMUCOSAL

## 2017-03-29 MED ORDER — CLINDAMYCIN PHOSPHATE 900 MG/50ML IV SOLN: 900.0000 mg | INTRAVENOUS | Status: AC

## 2017-03-29 MED ORDER — CELECOXIB 200 MG PO CAPS
ORAL_CAPSULE | ORAL | Status: AC
  Filled 2017-03-29: qty 2

## 2017-03-29 MED ORDER — PROPOFOL 10 MG/ML IV BOLUS
INTRAVENOUS | Status: AC
  Filled 2017-03-29: qty 40

## 2017-03-29 MED ORDER — CELECOXIB 400 MG PO CAPS: 400.0000 mg | ORAL_CAPSULE | ORAL | Status: DC

## 2017-03-29 MED ORDER — LACTATED RINGERS IV SOLN
INTRAVENOUS | Status: DC | PRN
  Administered 2017-03-29: 17:00:00 via INTRAVENOUS

## 2017-03-29 MED ORDER — MORPHINE SULFATE (PF) 4 MG/ML IV SOLN
4.0000 mg | Freq: Once | INTRAVENOUS | Status: AC
  Administered 2017-03-29: 4 mg via INTRAVENOUS
  Filled 2017-03-29: qty 1

## 2017-03-29 MED ORDER — MIDAZOLAM HCL 5 MG/5ML IJ SOLN
INTRAMUSCULAR | Status: DC | PRN
  Administered 2017-03-29: 1 mg via INTRAVENOUS

## 2017-03-29 MED ORDER — DIPHENHYDRAMINE HCL 50 MG/ML IJ SOLN
25.0000 mg | INTRAMUSCULAR | Status: DC
  Administered 2017-03-29 (×2): 25 mg via INTRAVENOUS

## 2017-03-29 MED ORDER — GENTAMICIN SULFATE 40 MG/ML IJ SOLN
5.0000 mg/kg | INTRAVENOUS | Status: AC
  Filled 2017-03-29: qty 10

## 2017-03-29 MED ORDER — BELLADONNA ALKALOIDS-OPIUM 16.2-60 MG RE SUPP
1.0000 | Freq: Once | RECTAL | Status: AC
  Administered 2017-03-29: 1 via RECTAL
  Filled 2017-03-29: qty 1

## 2017-03-29 MED ORDER — PROMETHAZINE HCL 25 MG/ML IJ SOLN: 6.2500 mg | INTRAMUSCULAR | Status: DC | PRN

## 2017-03-29 MED ORDER — HYDROMORPHONE HCL 1 MG/ML IJ SOLN
INTRAMUSCULAR | Status: DC | PRN
  Administered 2017-03-29 (×2): 1 mg via INTRAVENOUS

## 2017-03-29 MED ORDER — GABAPENTIN 300 MG PO CAPS
ORAL_CAPSULE | ORAL | Status: AC
  Filled 2017-03-29: qty 1

## 2017-03-29 MED ORDER — HYDROMORPHONE HCL 2 MG/ML IJ SOLN
INTRAMUSCULAR | Status: AC
  Filled 2017-03-29: qty 1

## 2017-03-29 MED ORDER — HYDROMORPHONE HCL 2 MG/ML IJ SOLN
0.5000 mg | INTRAMUSCULAR | Status: DC | PRN
  Administered 2017-03-29: 0.5 mg via INTRAVENOUS
  Administered 2017-03-29 – 2017-03-30 (×3): 2 mg via INTRAVENOUS
  Filled 2017-03-29 (×4): qty 1

## 2017-03-29 MED ORDER — CHLORHEXIDINE GLUCONATE CLOTH 2 % EX PADS
6.0000 | MEDICATED_PAD | Freq: Once | CUTANEOUS | Status: AC
  Administered 2017-03-29: 6 via TOPICAL

## 2017-03-29 MED ORDER — LACTATED RINGERS IV SOLN
INTRAVENOUS | Status: DC
  Administered 2017-03-29: 18:00:00 via INTRAVENOUS

## 2017-03-29 MED ORDER — ACETAMINOPHEN 325 MG PO TABS
650.0000 mg | ORAL_TABLET | Freq: Once | ORAL | Status: AC
  Administered 2017-03-29: 650 mg via ORAL
  Filled 2017-03-29: qty 2

## 2017-03-29 MED ORDER — FUROSEMIDE 10 MG/ML IJ SOLN
INTRAMUSCULAR | Status: AC
  Administered 2017-03-29: 10 mg via INTRAVENOUS
  Filled 2017-03-29: qty 2

## 2017-03-29 MED ORDER — CELECOXIB 400 MG PO CAPS
400.0000 mg | ORAL_CAPSULE | ORAL | Status: AC
  Administered 2017-03-29: 400 mg via ORAL
  Filled 2017-03-29: qty 1

## 2017-03-29 MED ORDER — METRONIDAZOLE IN NACL 5-0.79 MG/ML-% IV SOLN
500.0000 mg | Freq: Once | INTRAVENOUS | Status: DC
  Administered 2017-03-29: 500 mg via INTRAVENOUS
  Filled 2017-03-29: qty 100

## 2017-03-29 MED ORDER — OXYCODONE HCL 5 MG PO TABS: 5.0000 mg | ORAL_TABLET | ORAL | Status: DC | PRN

## 2017-03-29 MED ORDER — PROPOFOL 10 MG/ML IV BOLUS
INTRAVENOUS | Status: DC | PRN
  Administered 2017-03-29: 200 mg via INTRAVENOUS

## 2017-03-29 MED ORDER — WITCH HAZEL-GLYCERIN EX PADS
1.0000 "application " | MEDICATED_PAD | CUTANEOUS | Status: DC | PRN
  Filled 2017-03-29: qty 100

## 2017-03-29 MED ORDER — ONDANSETRON HCL 4 MG/2ML IJ SOLN
4.0000 mg | Freq: Once | INTRAMUSCULAR | Status: AC
  Administered 2017-03-29: 4 mg via INTRAVENOUS
  Filled 2017-03-29: qty 2

## 2017-03-29 MED ORDER — FENTANYL CITRATE (PF) 100 MCG/2ML IJ SOLN
INTRAMUSCULAR | Status: DC | PRN
  Administered 2017-03-29 (×3): 50 ug via INTRAVENOUS
  Administered 2017-03-29: 100 ug via INTRAVENOUS
  Administered 2017-03-29: 50 ug via INTRAVENOUS

## 2017-03-29 MED ORDER — DOLUTEGRAVIR SODIUM 50 MG PO TABS
50.0000 mg | ORAL_TABLET | Freq: Every day | ORAL | Status: DC
  Administered 2017-03-30 – 2017-04-02 (×4): 50 mg via ORAL
  Filled 2017-03-29 (×4): qty 1

## 2017-03-29 MED ORDER — MORPHINE SULFATE (PF) 4 MG/ML IV SOLN: 1.0000 mg | INTRAVENOUS | Status: DC | PRN

## 2017-03-29 MED ORDER — LABETALOL HCL 5 MG/ML IV SOLN
5.0000 mg | Freq: Once | INTRAVENOUS | Status: AC
  Administered 2017-03-29: 5 mg via INTRAVENOUS

## 2017-03-29 MED ORDER — CHLORHEXIDINE GLUCONATE CLOTH 2 % EX PADS: 6.0000 | MEDICATED_PAD | Freq: Once | CUTANEOUS | Status: DC

## 2017-03-29 MED ORDER — ACETAMINOPHEN 500 MG PO TABS
ORAL_TABLET | ORAL | Status: AC
  Filled 2017-03-29: qty 2

## 2017-03-29 MED ORDER — METRONIDAZOLE IN NACL 5-0.79 MG/ML-% IV SOLN
500.0000 mg | Freq: Three times a day (TID) | INTRAVENOUS | Status: DC
  Administered 2017-03-30: 500 mg via INTRAVENOUS
  Filled 2017-03-29 (×2): qty 100

## 2017-03-29 MED ORDER — ACETAMINOPHEN 500 MG PO TABS
1000.0000 mg | ORAL_TABLET | ORAL | Status: AC
  Administered 2017-03-29: 1000 mg via ORAL

## 2017-03-29 MED ORDER — HYDROMORPHONE HCL 2 MG/ML IJ SOLN: 0.2500 mg | INTRAMUSCULAR | Status: DC | PRN

## 2017-03-29 MED ORDER — CLINDAMYCIN PHOSPHATE 900 MG/50ML IV SOLN
INTRAVENOUS | Status: AC
  Filled 2017-03-29: qty 50

## 2017-03-29 MED ORDER — FUROSEMIDE 10 MG/ML IJ SOLN
10.0000 mg | Freq: Once | INTRAMUSCULAR | Status: AC
  Administered 2017-03-29: 10 mg via INTRAVENOUS

## 2017-03-29 MED ORDER — EMTRICITABINE-TENOFOVIR AF 200-25 MG PO TABS
1.0000 | ORAL_TABLET | Freq: Every day | ORAL | Status: DC
  Administered 2017-03-30 – 2017-04-02 (×4): 1 via ORAL
  Filled 2017-03-29 (×4): qty 1

## 2017-03-29 MED ORDER — BUPIVACAINE-EPINEPHRINE 0.25% -1:200000 IJ SOLN
INTRAMUSCULAR | Status: DC | PRN
  Administered 2017-03-29: 20 mL

## 2017-03-29 MED ORDER — DIPHENHYDRAMINE HCL 50 MG/ML IJ SOLN: 25.0000 mg | Freq: Four times a day (QID) | INTRAMUSCULAR | Status: DC | PRN

## 2017-03-29 MED ORDER — DEXTROSE 5 % IV SOLN
1000.0000 mg | Freq: Four times a day (QID) | INTRAVENOUS | Status: DC | PRN
  Administered 2017-03-30: 1000 mg via INTRAVENOUS
  Filled 2017-03-29 (×2): qty 10

## 2017-03-29 MED ORDER — DIPHENHYDRAMINE HCL 50 MG/ML IJ SOLN
INTRAMUSCULAR | Status: AC
  Filled 2017-03-29: qty 1

## 2017-03-29 MED ORDER — BUPIVACAINE-EPINEPHRINE (PF) 0.5% -1:200000 IJ SOLN
INTRAMUSCULAR | Status: AC
  Filled 2017-03-29: qty 1.8

## 2017-03-29 MED ORDER — ONDANSETRON HCL 4 MG/2ML IJ SOLN
INTRAMUSCULAR | Status: DC | PRN
  Administered 2017-03-29: 4 mg via INTRAVENOUS

## 2017-03-29 MED ORDER — LIDOCAINE HCL (CARDIAC) 20 MG/ML IV SOLN
INTRAVENOUS | Status: DC | PRN
  Administered 2017-03-29: 75 mg via INTRAVENOUS

## 2017-03-29 MED ORDER — POTASSIUM CHLORIDE IN NACL 20-0.9 MEQ/L-% IV SOLN
INTRAVENOUS | Status: DC
  Administered 2017-03-30: 04:00:00 via INTRAVENOUS
  Filled 2017-03-29 (×2): qty 1000

## 2017-03-29 MED ORDER — ONDANSETRON 4 MG PO TBDP: 4.0000 mg | ORAL_TABLET | Freq: Four times a day (QID) | ORAL | Status: DC | PRN

## 2017-03-29 MED ORDER — CIPROFLOXACIN IN D5W 400 MG/200ML IV SOLN
400.0000 mg | Freq: Once | INTRAVENOUS | Status: AC
  Administered 2017-03-29: 400 mg via INTRAVENOUS
  Filled 2017-03-29: qty 200

## 2017-03-29 MED ORDER — BUPIVACAINE HCL (PF) 0.25 % IJ SOLN
INTRAMUSCULAR | Status: AC
  Filled 2017-03-29: qty 30

## 2017-03-29 MED ORDER — ACETAMINOPHEN 500 MG PO TABS
1000.0000 mg | ORAL_TABLET | Freq: Three times a day (TID) | ORAL | Status: DC
  Administered 2017-03-30 – 2017-04-01 (×5): 1000 mg via ORAL
  Administered 2017-04-01: 500 mg via ORAL
  Administered 2017-04-01 – 2017-04-02 (×3): 1000 mg via ORAL
  Filled 2017-03-29 (×10): qty 2

## 2017-03-29 MED ORDER — GABAPENTIN 300 MG PO CAPS
300.0000 mg | ORAL_CAPSULE | ORAL | Status: AC
  Administered 2017-03-29: 300 mg via ORAL

## 2017-03-29 MED ORDER — LABETALOL HCL 5 MG/ML IV SOLN
5.0000 mg | INTRAVENOUS | Status: AC | PRN
  Administered 2017-03-29 (×2): 5 mg via INTRAVENOUS

## 2017-03-29 MED ORDER — CHLORHEXIDINE GLUCONATE 0.12 % MT SOLN
15.0000 mL | Freq: Two times a day (BID) | OROMUCOSAL | Status: DC
  Administered 2017-03-29 – 2017-04-01 (×6): 15 mL via OROMUCOSAL
  Filled 2017-03-29 (×7): qty 15

## 2017-03-29 MED ORDER — MORPHINE SULFATE (PF) 4 MG/ML IV SOLN
2.0000 mg | INTRAVENOUS | Status: DC | PRN
Start: 2017-03-29 — End: 2017-03-30

## 2017-03-29 MED ORDER — ONDANSETRON 8 MG PO TBDP
8.0000 mg | ORAL_TABLET | Freq: Once | ORAL | Status: AC
  Administered 2017-03-29: 8 mg via ORAL
  Filled 2017-03-29: qty 1

## 2017-03-29 MED ORDER — DEXAMETHASONE SODIUM PHOSPHATE 10 MG/ML IJ SOLN
INTRAMUSCULAR | Status: AC
  Filled 2017-03-29: qty 1

## 2017-03-29 MED ORDER — METRONIDAZOLE IN NACL 5-0.79 MG/ML-% IV SOLN
INTRAVENOUS | Status: AC
  Filled 2017-03-29: qty 100

## 2017-03-29 MED ORDER — IOPAMIDOL (ISOVUE-300) INJECTION 61%
INTRAVENOUS | Status: AC
Start: 2017-03-29 — End: 2017-03-29
  Administered 2017-03-29: 100 mL via INTRAVENOUS
  Filled 2017-03-29: qty 100

## 2017-03-29 MED ORDER — TRAMADOL HCL 50 MG PO TABS: 50.0000 mg | ORAL_TABLET | Freq: Four times a day (QID) | ORAL | Status: DC | PRN

## 2017-03-29 MED ORDER — CIPROFLOXACIN IN D5W 400 MG/200ML IV SOLN
400.0000 mg | Freq: Two times a day (BID) | INTRAVENOUS | Status: DC
  Administered 2017-03-29: 400 mg via INTRAVENOUS
  Filled 2017-03-29: qty 200

## 2017-03-29 MED ORDER — GENTAMICIN SULFATE 40 MG/ML IJ SOLN
5.0000 mg/kg | INTRAVENOUS | Status: DC
  Filled 2017-03-29: qty 10

## 2017-03-29 MED ORDER — LISINOPRIL 20 MG PO TABS
20.0000 mg | ORAL_TABLET | Freq: Every day | ORAL | Status: DC
Start: 2017-03-30 — End: 2017-04-02
  Administered 2017-03-30 – 2017-04-02 (×4): 20 mg via ORAL
  Filled 2017-03-29: qty 1
  Filled 2017-03-29: qty 2
  Filled 2017-03-29 (×2): qty 1

## 2017-03-29 SURGICAL SUPPLY — 31 items
BLADE SURG 15 STRL LF DISP TIS (BLADE) ×1 IMPLANT
BLADE SURG 15 STRL SS (BLADE) ×2
BRIEF STRETCH FOR OB PAD LRG (UNDERPADS AND DIAPERS) IMPLANT
COVER SURGICAL LIGHT HANDLE (MISCELLANEOUS) ×3 IMPLANT
DRAPE LAPAROTOMY T 102X78X121 (DRAPES) ×3 IMPLANT
DRSG PAD ABDOMINAL 8X10 ST (GAUZE/BANDAGES/DRESSINGS) IMPLANT
ELECT PENCIL ROCKER SW 15FT (MISCELLANEOUS) ×3 IMPLANT
ELECT REM PT RETURN 15FT ADLT (MISCELLANEOUS) ×3 IMPLANT
GAUZE SPONGE 4X4 12PLY STRL (GAUZE/BANDAGES/DRESSINGS) ×3 IMPLANT
GAUZE SPONGE 4X4 16PLY XRAY LF (GAUZE/BANDAGES/DRESSINGS) ×3 IMPLANT
GLOVE ECLIPSE 8.0 STRL XLNG CF (GLOVE) IMPLANT
GLOVE INDICATOR 8.0 STRL GRN (GLOVE) IMPLANT
GOWN STRL REUS W/TWL XL LVL3 (GOWN DISPOSABLE) ×6 IMPLANT
KIT BASIN OR (CUSTOM PROCEDURE TRAY) ×3 IMPLANT
LUBRICANT JELLY K Y 4OZ (MISCELLANEOUS) ×3 IMPLANT
NEEDLE HYPO 22GX1.5 SAFETY (NEEDLE) IMPLANT
PACK BASIC VI WITH GOWN DISP (CUSTOM PROCEDURE TRAY) ×3 IMPLANT
PAD ABD 8X10 STRL (GAUZE/BANDAGES/DRESSINGS) ×3 IMPLANT
SUCTION FRAZIER HANDLE 12FR (TUBING)
SUCTION TUBE FRAZIER 12FR DISP (TUBING) IMPLANT
SUT CHROMIC 2 0 SH (SUTURE) IMPLANT
SUT CHROMIC 3 0 SH 27 (SUTURE) IMPLANT
SUT VIC AB 2-0 UR6 27 (SUTURE) IMPLANT
SWAB COLLECTION DEVICE MRSA (MISCELLANEOUS) IMPLANT
SWAB CULTURE ESWAB REG 1ML (MISCELLANEOUS) IMPLANT
SYR 20CC LL (SYRINGE) IMPLANT
SYR 5ML LL (SYRINGE) IMPLANT
SYR BULB IRRIGATION 50ML (SYRINGE) IMPLANT
TOWEL OR 17X26 10 PK STRL BLUE (TOWEL DISPOSABLE) ×3 IMPLANT
TOWEL OR NON WOVEN STRL DISP B (DISPOSABLE) ×3 IMPLANT
YANKAUER SUCT BULB TIP 10FT TU (MISCELLANEOUS) ×3 IMPLANT

## 2017-03-29 NOTE — Op Note (Signed)
03/29/2017  6:34 PM  PATIENT:  Danny Camacho  26 y.o. male  PRE-OPERATIVE DIAGNOSIS:  rectal abcess  POST-OPERATIVE DIAGNOSIS:  rectal abcess  PROCEDURE:  Procedure(s): EXAM UNDER ANESTHESIA IRRIGATION AND DEBRIDEMENT RECTAL ABSCESS (N/A)  SURGEON:  Surgeon(s) and Role:    * Griselda Miner, MD - Primary  PHYSICIAN ASSISTANT:   ASSISTANTS: none   ANESTHESIA:   local and general  EBL:  No intake/output data recorded.  BLOOD ADMINISTERED:none  DRAINS: none   LOCAL MEDICATIONS USED:  MARCAINE     SPECIMEN:  No Specimen  DISPOSITION OF SPECIMEN:  N/A  COUNTS:  YES  TOURNIQUET:  * No tourniquets in log *  DICTATION: .Dragon Dictation   After informed consent was obtained the patient was brought to the operating room and placed in the supine position on the operating room table. After adequate induction of general anesthesia the patient was moved into lithotomy position and all pressure points are padded. His perirectal area was then prepped with Betadine and draped in usual sterile manner. An appropriate timeout was performed. The perirectal region was then infiltrated with quarter percent Marcaine. A bullet retractor was placed inside the rectum and the rectum was examined circumferentially. There was some inflammation and exudate but no evidence of pus draining from within the rectum. The abscess was palpable in the left posterior position. The abscess was initially aspirated with an 18-gauge needle and cultures were obtained. A small radially oriented incision was then made in the same position with a 15 blade knife. The incision was carried through the skin and subcutaneous tissue sharply with electrocautery. The tract was then probed bluntly with a hemostat until the abscess was completely opened and drained. Hemostasis was achieved using the Bovie electrocautery. The wound was then packed with moistened Kerlix gauze and sterile dressings were applied. The patient tolerated  procedure well. At the end of the case all needle sponge and instrument counts were correct. The patient was then awakened and taken to recovery in stable condition.  PLAN OF CARE: Admit to inpatient   PATIENT DISPOSITION:  PACU - hemodynamically stable.   Delay start of Pharmacological VTE agent (>24hrs) due to surgical blood loss or risk of bleeding: no

## 2017-03-29 NOTE — Anesthesia Preprocedure Evaluation (Addendum)
Anesthesia Evaluation  Patient identified by MRN, date of birth, ID band Patient awake    Reviewed: Allergy & Precautions, NPO status , Patient's Chart, lab work & pertinent test results  History of Anesthesia Complications Negative for: history of anesthetic complications  Airway Mallampati: II  TM Distance: >3 FB Neck ROM: Full    Dental no notable dental hx. (+) Dental Advisory Given   Pulmonary neg pulmonary ROS,    Pulmonary exam normal        Cardiovascular hypertension, Pt. on medications Normal cardiovascular exam     Neuro/Psych negative neurological ROS  negative psych ROS   GI/Hepatic negative GI ROS, Neg liver ROS,   Endo/Other  negative endocrine ROS  Renal/GU negative Renal ROS  negative genitourinary   Musculoskeletal negative musculoskeletal ROS (+)   Abdominal   Peds negative pediatric ROS (+)  Hematology  (+) HIV,   Anesthesia Other Findings   Reproductive/Obstetrics negative OB ROS                            Anesthesia Physical Anesthesia Plan  ASA: II  Anesthesia Plan: General   Post-op Pain Management:    Induction: Intravenous  Airway Management Planned: LMA  Additional Equipment:   Intra-op Plan:   Post-operative Plan: Extubation in OR  Informed Consent: I have reviewed the patients History and Physical, chart, labs and discussed the procedure including the risks, benefits and alternatives for the proposed anesthesia with the patient or authorized representative who has indicated his/her understanding and acceptance.   Dental advisory given  Plan Discussed with: CRNA, Anesthesiologist and Surgeon  Anesthesia Plan Comments:        Anesthesia Quick Evaluation

## 2017-03-29 NOTE — ED Provider Notes (Signed)
[facility]-EMERGENCY DEPT Provider Note   CSN: [id number] Arrival date & time: 03/29/17  0524     History   Chief Complaint Chief Complaint  Patient presents with  . Abdominal Pain    HPI Danny Camacho is a 26 y.o. male presenting with rectal pain, nausea, vomiting and non-radiating LLQ pain . He was seen twice in the emergency department yesterday for rectal pain and was sent home with a script for zofran and tramadol. He has not picked up zofran and reports that he has been vomiting since last night and cannot keep down the tramadol for his pain. He states that the LLQ pain and vomiting is new since last visit and he has not had a BM in 48hrs or passed gas and he typically has 2-3 bm per day consistently. The pain is exacerbated by sitting or bearing down to have a bowel movement. He reports resolved hemorrhoids 2 weeks ago. Also endorses cold sweats. He denies hematuria, dysuria, blood in the stool.  HPI  Past Medical History:  Diagnosis Date  . HIV (human immunodeficiency virus infection) (HCC)   . Hypertension     Patient Active Problem List   Diagnosis Date Noted  . 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 Perirectal abscess 03/29/2017  . HIV (human immunodeficiency virus infection) (HCC)   . Hypertension     Past Surgical History:  Procedure Laterality Date  . FRACTURE SURGERY     left leg fx w/ rod placement  . TONSILLECTOMY         Home Medications    Prior to Admission medications   Medication Sig Start Date End Date Taking? Authorizing Provider  dolutegravir (TIVICAY) 50 MG tablet Take 50 mg by mouth daily.   Yes Historical Provider, MD  emtricitabine-tenofovir AF (DESCOVY) 200-25 MG tablet Take 1 tablet by mouth daily.   Yes Historical Provider, MD  fluticasone (FLONASE) 50 MCG/ACT nasal spray Place 2 sprays into both nostrils daily as needed for allergies.    Yes Historical Provider, MD  ibuprofen (ADVIL,MOTRIN) 200 MG tablet Take 400 mg by mouth every 6 (six) hours  as needed for fever or headache.   Yes Historical Provider, MD  lisinopril (PRINIVIL,ZESTRIL) 20 MG tablet Take 1 tablet (20 mg total) by mouth daily. Patient taking differently: Take 10 mg by mouth daily.  11/29/15  Yes Mady Gemma, PA-C  loratadine (CLARITIN) 10 MG tablet Take 10 mg by mouth daily.   Yes Historical Provider, MD  traMADol (ULTRAM) 50 MG tablet Take 1 tablet (50 mg total) by mouth every 12 (twelve) hours as needed. 03/28/17  Yes Tomasita Crumble, MD  acetaminophen (TYLENOL) 500 MG tablet Take 1 tablet (500 mg total) by mouth every 6 (six) hours as needed. Patient not taking: Reported on 03/29/2017 10/28/16   Joni Reining Pisciotta, PA-C  benzonatate (TESSALON) 100 MG capsule Take 1 capsule (100 mg total) by mouth every 8 (eight) hours. Patient not taking: Reported on 03/29/2017 09/08/16   Rise Mu, PA-C  hydrocortisone (ANUSOL-HC) 2.5 % rectal cream Apply rectally 2 times daily Patient not taking: Reported on 03/29/2017 03/28/17   Earley Favor, NP  ibuprofen (ADVIL,MOTRIN) 600 MG tablet Take 1 tablet (600 mg total) by mouth every 6 (six) hours as needed. Patient not taking: Reported on 04/28/2016 08/13/15   Lyndal Pulley, MD  ondansetron (ZOFRAN ODT) 4 MG disintegrating tablet Take 1 tablet (4 mg total) by mouth every 8 (eight) hours as needed for nausea or vomiting. Patient not taking: Reported on 09/08/2016  04/29/16   Shari Upstill, PA-C  ondansetron (ZOFRAN ODT) 4 MG disintegrating tablet Take 1 tablet (4 mg total) by mouth every 8 (eight) hours as needed for nausea or vomiting. Patient not taking: Reported on 03/29/2017 03/28/17   Earley Favor, NP    Family History History reviewed. No pertinent family history.  Social History Social History  Substance Use Topics  . Smoking status: Never Smoker  . Smokeless tobacco: Never Used  . Alcohol use No     Allergies   Penicillins   Review of Systems Review of Systems  Constitutional: Positive for appetite change and chills.  Negative for fever.  HENT: Negative for congestion, ear pain, sore throat and trouble swallowing.   Eyes: Negative for pain and visual disturbance.  Respiratory: Negative for cough, chest tightness, shortness of breath, wheezing and stridor.   Cardiovascular: Negative for chest pain, palpitations and leg swelling.  Gastrointestinal: Positive for abdominal pain, nausea, rectal pain and vomiting. Negative for abdominal distention, anal bleeding, blood in stool and diarrhea.  Genitourinary: Negative for dysuria, flank pain, frequency and hematuria.  Musculoskeletal: Negative for arthralgias, back pain, myalgias, neck pain and neck stiffness.  Skin: Negative for color change, pallor and rash.  Neurological: Negative for dizziness, seizures, syncope and speech difficulty.     Physical Exam Updated Vital Signs BP (!) 142/94   Pulse 89   Temp 98.2 F (36.8 C) (Oral)   Resp 16   SpO2 95%   Physical Exam  Constitutional: He appears well-developed and well-nourished. No distress.  Afebrile, nontoxic-appearing, lying uncomfortably in bed in no acute distress.  HENT:  Head: Normocephalic and atraumatic.  Mouth/Throat: Oropharynx is clear and moist. No oropharyngeal exudate.  Eyes: Conjunctivae and EOM are normal.  Neck: Normal range of motion. Neck supple.  Cardiovascular: Normal rate, regular rhythm, normal heart sounds and intact distal pulses.   No murmur heard. Pulmonary/Chest: Effort normal and breath sounds normal. No respiratory distress. He has no wheezes. He has no rales. He exhibits no tenderness.  Abdominal: Soft. He exhibits no distension and no mass. There is tenderness. There is no rebound and no guarding.  Decreased bowel sounds. Left lower quadrant tenderness palpation, negative Murphy sign, negative McBurney's point tenderness, negative rebound, negative mass.  Genitourinary: Rectum normal. Rectal exam shows guaiac negative stool.  Genitourinary Comments: Patient had good  rectal tone, pain on exam, no hemorrhoids noted.  Musculoskeletal: He exhibits no edema.  Neurological: He is alert.  Skin: Skin is warm and dry. No rash noted. He is not diaphoretic. No erythema. No pallor.  Psychiatric: He has a normal mood and affect.  Nursing note and vitals reviewed.    ED Treatments / Results  Labs (all labs ordered are listed, but only abnormal results are displayed) Labs Reviewed  CBC WITH DIFFERENTIAL/PLATELET - Abnormal; Notable for the following:       Result Value   WBC 14.9 (*)    Neutro Abs 11.9 (*)    Monocytes Absolute 1.6 (*)    All other components within normal limits  COMPREHENSIVE METABOLIC PANEL - Abnormal; Notable for the following:    Glucose, Bld 102 (*)    Total Bilirubin 1.6 (*)    All other components within normal limits  URINALYSIS, ROUTINE W REFLEX MICROSCOPIC - Abnormal; Notable for the following:    Specific Gravity, Urine 1.033 (*)    Hgb urine dipstick MODERATE (*)    Ketones, ur 80 (*)    Protein, ur 30 (*)  Leukocytes, UA TRACE (*)    Squamous Epithelial / LPF 0-5 (*)    All other components within normal limits  URINE CULTURE  LIPASE, BLOOD  POC OCCULT BLOOD, ED    EKG  EKG Interpretation None       Radiology Ct Abdomen Pelvis W Contrast  Result Date: 03/29/2017 CLINICAL DATA:  Left lower quadrant pain. EXAM: CT ABDOMEN AND PELVIS WITH CONTRAST TECHNIQUE: Multidetector CT imaging of the abdomen and pelvis was performed using the standard protocol following bolus administration of intravenous contrast. CONTRAST:  ISOVUE-300 IOPAMIDOL (ISOVUE-300) INJECTION 61% COMPARISON:  None. FINDINGS: Lower chest:  No contributory findings. Hepatobiliary: Subcentimeter low-density in the high right liver, too small for densitometry, statistically a cyst.No evidence of biliary obstruction or stone. Pancreas: Unremarkable. Spleen: Unremarkable. Adrenals/Urinary Tract: Negative adrenals. No hydronephrosis or stone.  Unremarkable bladder. Stomach/Bowel: There is a 23 mm ovoid rim enhancing collection at the 5 o'clock position of the anus, deep to the sphincteric musculature. There is underlying proctitis with mucosal hyperenhancement and irregularity. Multiple enlarged perianal and perirectal lymph nodes, measuring up to 15 mm short axis. The remainder of the bowel shows no inflammation. Normal appendix. Vascular/Lymphatic: No acute vascular abnormality. Adenopathy described above. Reproductive:No pathologic findings. Other: No ascites or pneumoperitoneum. Musculoskeletal: No acute or aggressive finding. No evidence of spondyloarthropathy. IMPRESSION: 1. 23 mm perianal abscess at the 5 o'clock position. 2. Underlying proctitis with prominent mesenteric adenopathy. Recommend visualization or imaging follow-up to exclude malignancy. Electronically Signed   By: Marnee Spring M.D.   On: 03/29/2017 09:12   Dg Abd 2 Views  Result Date: 03/28/2017 CLINICAL DATA:  Recent anal sex. Abdominal pain and nausea for 3 days. EXAM: ABDOMEN - 2 VIEW COMPARISON:  Chest 09/08/2016 FINDINGS: Gas and stool throughout the colon. No small or large bowel distention. No free intra- abdominal air. No abnormal air-fluid levels. No radiopaque stones. Visualized bones appear intact. Soft tissue contours are unremarkable. IMPRESSION: Nonobstructive bowel gas pattern. Electronically Signed   By: Burman Nieves M.D.   On: 03/28/2017 05:36    Procedures Procedures (including critical care time)  Medications Ordered in ED Medications  Chlorhexidine Gluconate Cloth 2 % PADS 6 each (not administered)    And  Chlorhexidine Gluconate Cloth 2 % PADS 6 each (0 each Topical Hold 03/29/17 1435)  gabapentin (NEURONTIN) capsule 300 mg (not administered)  acetaminophen (TYLENOL) tablet 1,000 mg (not administered)  celecoxib (CELEBREX) 400 MG capsule 400 mg (not administered)  clindamycin (CLEOCIN) IVPB 900 mg (not administered)    And  gentamicin  (GARAMYCIN) 450 mg in dextrose 5 % 100 mL IVPB (not administered)  HYDROmorphone (DILAUDID) injection 0.5-2 mg (not administered)  oxyCODONE (Oxy IR/ROXICODONE) immediate release tablet 5-10 mg (not administered)  methocarbamol (ROBAXIN) 1,000 mg in dextrose 5 % 50 mL IVPB (not administered)  acetaminophen (TYLENOL) tablet 1,000 mg (not administered)  witch hazel-glycerin (TUCKS) pad 1 application (not administered)  hydrocortisone-pramoxine (ANALPRAM-HC) 2.5-1 % rectal cream 1 application (not administered)  0.9 % NaCl with KCl 20 mEq/ L  infusion (not administered)  ciprofloxacin (CIPRO) IVPB 400 mg (not administered)  metroNIDAZOLE (FLAGYL) IVPB 500 mg (not administered)  morphine 4 MG/ML injection 2-4 mg (not administered)  diphenhydrAMINE (BENADRYL) capsule 25 mg (not administered)    Or  diphenhydrAMINE (BENADRYL) injection 25 mg (not administered)  ondansetron (ZOFRAN-ODT) disintegrating tablet 4 mg (not administered)    Or  ondansetron (ZOFRAN) injection 4 mg (not administered)  sodium chloride 0.9 % bolus 1,000 mL (0  mLs Intravenous Stopped 03/29/17 0842)  ondansetron (ZOFRAN) injection 4 mg (4 mg Intravenous Given 03/29/17 0726)  morphine 4 MG/ML injection 4 mg (4 mg Intravenous Given 03/29/17 0726)  opium-belladonna (B&O SUPPRETTES) 16.2-60 MG suppository 1 suppository (1 suppository Rectal Given 03/29/17 0904)  iopamidol (ISOVUE-300) 61 % injection (100 mLs Intravenous Contrast Given 03/29/17 0842)  sodium chloride 0.9 % bolus 1,000 mL (0 mLs Intravenous Stopped 03/29/17 1026)  acetaminophen (TYLENOL) tablet 650 mg (650 mg Oral Given 03/29/17 0945)  ciprofloxacin (CIPRO) IVPB 400 mg (0 mg Intravenous Stopped 03/29/17 1212)  morphine 4 MG/ML injection 4 mg (4 mg Intravenous Given 03/29/17 1250)  ondansetron (ZOFRAN-ODT) disintegrating tablet 8 mg (8 mg Oral Given 03/29/17 1250)     Initial Impression / Assessment and Plan / ED Course  I have reviewed the triage vital signs and the  nursing notes.  Pertinent labs & imaging results that were available during my care of the patient were reviewed by me and considered in my medical decision making (see chart for details).    Patient presenting with rectal pain, decreased bowel movement, left lower quadrant pain, nausea, vomiting. He was seen yesterday for same, sent home with pain management and zofran. He has not been able to keep the medication down as he has been vomiting. He denies picking up his script for zofran. Now with LLQ pain and no BM or gas.  Will rehydrate, control pain and nausea and obtain CT abdomen and pelvis.  CT with 23 mm perianal abscess proctitis and lymphadenopathy.  Consulted general surgery Patient's last CD4 in this system showing low count, but patient reports normal count this month. He is currently followed at the [place]. Discussed patient with Dr. Fayrene Fearing who recommends consulting surgery prior to intiiation of antibiotics.  9:47 --Consult placed to surgery  10:40 am -- Spoke to Surgery and awaiting attending to review CT for further management. Initiated IV CIPRO and metronidazole  Patient was seen by general surgery who agree that he will need to come in for I&D under anesthesia. Recommend Infectious Disease involvement.  12:45 Placed consult to ID and admission to medicine ID- will be seeing him.  14:00 Spoke to hospitalist regarding this patient. She will be contacting surgery. Patient will be admitted.  Final Clinical Impressions(s) / ED Diagnoses   Final diagnoses:  Perianal abscess    New Prescriptions New Prescriptions   No medications on file     Gregary Cromer 03/29/17 1441    Rolland Porter, MD 04/14/17 2352

## 2017-03-29 NOTE — ED Notes (Signed)
Patient transported to CT 

## 2017-03-29 NOTE — ED Triage Notes (Signed)
Abdominal pain for 4 days seen at [facility] yesterday for same got blood work and sent home.  Sates he has constipation with nausea and vomiting.

## 2017-03-29 NOTE — ED Notes (Signed)
Report given to Danny Camacho, pt ready for transport 1520.

## 2017-03-29 NOTE — ED Notes (Signed)
5W requests patient wait 20 minutes to come to room.

## 2017-03-29 NOTE — Anesthesia Postprocedure Evaluation (Addendum)
Anesthesia Post Note  Patient: Danny Camacho  Procedure(s) Performed: Procedure(s) (LRB): EXAM UNDER ANESTHESIA IRRIGATION AND DEBRIDEMENT RECTAL ABSCESS (N/A)  Patient location during evaluation: PACU Anesthesia Type: General Level of consciousness: sedated Pain management: pain level controlled Vital Signs Assessment: post-procedure vital signs reviewed and stable Respiratory status: spontaneous breathing and respiratory function stable Cardiovascular status: stable Anesthetic complications: yes Anesthetic complication details: respiratory eventComments: Pt developed laryngospasm after removal of LMA and required Sux to ventilate.  Subsequently developed negative pressure pulmonary edema.  Treated with supplemental oxygen, lasix, labetalol. Improvement seen, stable on d/c.       Last Vitals:  Vitals:   03/29/17 2010 03/29/17 2020  BP: 135/90 (!) 143/89  Pulse: (!) 115 90  Resp: (!) 24 16  Temp: 36.8 C 36.7 C                Melissa Tomaselli DANIEL

## 2017-03-29 NOTE — Anesthesia Procedure Notes (Signed)
Procedure Name: LMA Insertion Date/Time: 03/29/2017 6:08 PM Performed by: Anastasio Champion E Pre-anesthesia Checklist: Patient identified, Emergency Drugs available, Suction available and Patient being monitored Patient Re-evaluated:Patient Re-evaluated prior to inductionOxygen Delivery Method: Circle system utilized Preoxygenation: Pre-oxygenation with 100% oxygen Intubation Type: IV induction Ventilation: Mask ventilation without difficulty LMA: LMA with gastric port inserted LMA Size: 5.0 Tube type: Oral Number of attempts: 1 Airway Equipment and Method: Oral airway Placement Confirmation: positive ETCO2 Tube secured with: Tape Dental Injury: Teeth and Oropharynx as per pre-operative assessment

## 2017-03-29 NOTE — Transfer of Care (Signed)
Immediate Anesthesia Transfer of Care Note  Patient: Danny Camacho  Procedure(s) Performed: Procedure(s): EXAM UNDER ANESTHESIA IRRIGATION AND DEBRIDEMENT RECTAL ABSCESS (N/A)  Patient Location: PACU  Anesthesia Type:General  Level of Consciousness: awake, alert , oriented and patient cooperative  Airway & Oxygen Therapy: Patient Spontanous Breathing and Patient connected to face mask oxygen  Post-op Assessment: Report given to RN, Post -op Vital signs reviewed and stable and Patient moving all extremities X 4  Post vital signs: stable  Last Vitals:  Vitals:   03/29/17 1645 03/29/17 1730  BP: 115/70   Pulse: 92   Resp: 16   Temp: 37.4 C (!) 38.3 C    Last Pain:  Vitals:   03/29/17 1730  TempSrc:   PainSc: 8       Patients Stated Pain Goal: 4 (03/29/17 1533)  Complications: appears to have negative pressure  pulmonary edema , coughinh up  Blood tinged sputum O2 sats 84- 91% nonrebreatjher on 10L/min

## 2017-03-29 NOTE — ED Notes (Signed)
Pharmacy notified to change administration times for cipro and flagyl, first dose given at 12.

## 2017-03-29 NOTE — Consult Note (Signed)
Reason for Consult:  Proctitis with Perianal abscess/HIV Referring Physician: Augusto Garbe is an 26 y.o. male.  HPI: 26 Y/[facility]  He was seen yesterday with rectal pain and reported anal sex about 2 weeks ago and started having pain about 2 days prior to that visit. He also complained of right foot pain.  He was treated for proctitis and discharged home.  He returns now with nausea and vomiting that started last evening and lasted throughout the night. He reports no BM x 48 hours, he normally has 2-3 bowel movements per day..  Pain is exacerbated by sitting or bearing down to have Bm.  He says it hurts when he walks and reports pain in both thighs down to his feet. He describes it as a numbness in his feet. He says is difficult to void. He says when he voids it's more like a dribble.  No reported hematuria or blood in stool.  He reports taking his HIV medicines regularly. His last CD4 count in November was 800. He's recently had blood work done at the [place], but does not have the results.  Work up in the ED this AM: he is afebrile, VSS.WBC is up to 14.9, H/H is 15.9/45, platelets are normal.  UA shows TNTC RBC's.  Fecal occult negative  CT scan shows:  23 mm perianal abscess at the 5 o'clock position. 2. Underlying proctitis with prominent mesenteric adenopathy.  We are ask to see.  Past Medical History:  Diagnosis Date  . HIV (human immunodeficiency virus infection) (HCC)   . Hypertension     Past Surgical History:  Procedure Laterality Date  . FRACTURE SURGERY     left leg fx w/ rod placement  . TONSILLECTOMY      History reviewed. No pertinent family history.  Social History:  reports that he has never smoked. He has never used smokeless tobacco. He reports that he uses drugs, including Marijuana. He reports that he does not drink alcohol.  Allergies:  Allergies  Allergen Reactions  . Penicillins     Unknown Has patient had a PCN reaction causing  immediate rash, facial/tongue/throat swelling, SOB or lightheadedness with hypotension: No Has patient had a PCN reaction causing severe rash involving mucus membranes or skin necrosis: No Has patient had a PCN reaction that required hospitalization No Has patient had a PCN reaction occurring within the last 10 years: No If all of the above answers are "NO", then may proceed with Cephalosporin use.     Medications: Prior to Admission:  Prior to Admission medications   Medication Sig Start Date End Date Taking? Authorizing Provider  dolutegravir (TIVICAY) 50 MG tablet Take 50 mg by mouth daily.   Yes Historical Provider, MD  emtricitabine-tenofovir AF (DESCOVY) 200-25 MG tablet Take 1 tablet by mouth daily.   Yes Historical Provider, MD  fluticasone (FLONASE) 50 MCG/ACT nasal spray Place 2 sprays into both nostrils daily as needed for allergies.    Yes Historical Provider, MD  ibuprofen (ADVIL,MOTRIN) 200 MG tablet Take 400 mg by mouth every 6 (six) hours as needed for fever or headache.   Yes Historical Provider, MD  lisinopril (PRINIVIL,ZESTRIL) 20 MG tablet Take 1 tablet (20 mg total) by mouth daily. Patient taking differently: Take 10 mg by mouth daily.  11/29/15  Yes Mady Gemma, PA-C  loratadine (CLARITIN) 10 MG tablet Take 10 mg by mouth daily.   Yes Historical Provider, MD  traMADol Janean Sark) 50  MG tablet Take 1 tablet (50 mg total) by mouth every 12 (twelve) hours as needed. 03/28/17  Yes Everlene Balls, MD  acetaminophen (TYLENOL) 500 MG tablet Take 1 tablet (500 mg total) by mouth every 6 (six) hours as needed. Patient not taking: Reported on 03/29/2017 10/28/16   Elmyra Ricks Pisciotta, PA-C  benzonatate (TESSALON) 100 MG capsule Take 1 capsule (100 mg total) by mouth every 8 (eight) hours. Patient not taking: Reported on 03/29/2017 09/08/16   Doristine Devoid, PA-C  hydrocortisone (ANUSOL-HC) 2.5 % rectal cream Apply rectally 2 times daily Patient not taking: Reported on 03/29/2017  03/28/17   Junius Creamer, NP  ibuprofen (ADVIL,MOTRIN) 600 MG tablet Take 1 tablet (600 mg total) by mouth every 6 (six) hours as needed. Patient not taking: Reported on 04/28/2016 08/13/15   Leo Grosser, MD  ondansetron (ZOFRAN ODT) 4 MG disintegrating tablet Take 1 tablet (4 mg total) by mouth every 8 (eight) hours as needed for nausea or vomiting. Patient not taking: Reported on 09/08/2016 04/29/16   Charlann Lange, PA-C  ondansetron (ZOFRAN ODT) 4 MG disintegrating tablet Take 1 tablet (4 mg total) by mouth every 8 (eight) hours as needed for nausea or vomiting. Patient not taking: Reported on 03/29/2017 03/28/17   Junius Creamer, NP     Results for orders placed or performed during the hospital encounter of 03/29/17 (from the past 48 hour(s))  Urinalysis, Routine w reflex microscopic     Status: Abnormal   Collection Time: 03/29/17  7:02 AM  Result Value Ref Range   Color, Urine YELLOW YELLOW   APPearance CLEAR CLEAR   Specific Gravity, Urine 1.033 (H) 1.005 - 1.030   pH 5.0 5.0 - 8.0   Glucose, UA NEGATIVE NEGATIVE mg/dL   Hgb urine dipstick MODERATE (A) NEGATIVE   Bilirubin Urine NEGATIVE NEGATIVE   Ketones, ur 80 (A) NEGATIVE mg/dL   Protein, ur 30 (A) NEGATIVE mg/dL   Nitrite NEGATIVE NEGATIVE   Leukocytes, UA TRACE (A) NEGATIVE   RBC / HPF TOO NUMEROUS TO COUNT 0 - 5 RBC/hpf   WBC, UA 0-5 0 - 5 WBC/hpf   Bacteria, UA NONE SEEN NONE SEEN   Squamous Epithelial / LPF 0-5 (A) NONE SEEN   Mucous PRESENT   CBC with Differential     Status: Abnormal   Collection Time: 03/29/17  7:18 AM  Result Value Ref Range   WBC 14.9 (H) 4.0 - 10.5 K/uL   RBC 5.24 4.22 - 5.81 MIL/uL   Hemoglobin 15.9 13.0 - 17.0 g/dL   HCT 45.4 39.0 - 52.0 %   MCV 86.6 78.0 - 100.0 fL   MCH 30.3 26.0 - 34.0 pg   MCHC 35.0 30.0 - 36.0 g/dL   RDW 12.4 11.5 - 15.5 %   Platelets 202 150 - 400 K/uL   Neutrophils Relative % 80 %   Neutro Abs 11.9 (H) 1.7 - 7.7 K/uL   Lymphocytes Relative 9 %   Lymphs Abs 1.3 0.7 - 4.0  K/uL   Monocytes Relative 11 %   Monocytes Absolute 1.6 (H) 0.1 - 1.0 K/uL   Eosinophils Relative 0 %   Eosinophils Absolute 0.1 0.0 - 0.7 K/uL   Basophils Relative 0 %   Basophils Absolute 0.0 0.0 - 0.1 K/uL  Comprehensive metabolic panel     Status: Abnormal   Collection Time: 03/29/17  7:18 AM  Result Value Ref Range   Sodium 138 135 - 145 mmol/L   Potassium 3.8 3.5 - 5.1 mmol/L  Chloride 105 101 - 111 mmol/L   CO2 24 22 - 32 mmol/L   Glucose, Bld 102 (H) 65 - 99 mg/dL   BUN 12 6 - 20 mg/dL   Creatinine, Ser 1.16 0.61 - 1.24 mg/dL   Calcium 9.3 8.9 - 10.3 mg/dL   Total Protein 8.1 6.5 - 8.1 g/dL   Albumin 4.3 3.5 - 5.0 g/dL   AST 18 15 - 41 U/L   ALT 18 17 - 63 U/L   Alkaline Phosphatase 81 38 - 126 U/L   Total Bilirubin 1.6 (H) 0.3 - 1.2 mg/dL   GFR calc non Af Amer >60 >60 mL/min   GFR calc Af Amer >60 >60 mL/min    Comment: (NOTE) The eGFR has been calculated using the CKD EPI equation. This calculation has not been validated in all clinical situations. eGFR's persistently <60 mL/min signify possible Chronic Kidney Disease.    Anion gap 9 5 - 15  Lipase, blood     Status: None   Collection Time: 03/29/17  7:18 AM  Result Value Ref Range   Lipase 13 11 - 51 U/L  POC occult blood, ED     Status: None   Collection Time: 03/29/17  7:42 AM  Result Value Ref Range   Fecal Occult Bld NEGATIVE NEGATIVE    Ct Abdomen Pelvis W Contrast  Result Date: 03/29/2017 CLINICAL DATA:  Left lower quadrant pain. EXAM: CT ABDOMEN AND PELVIS WITH CONTRAST TECHNIQUE: Multidetector CT imaging of the abdomen and pelvis was performed using the standard protocol following bolus administration of intravenous contrast. CONTRAST:  156m ISOVUE-300 IOPAMIDOL (ISOVUE-300) INJECTION 61% COMPARISON:  None. FINDINGS: Lower chest:  No contributory findings. Hepatobiliary: Subcentimeter low-density in the high right liver, too small for densitometry, statistically a cyst.No evidence of biliary  obstruction or stone. Pancreas: Unremarkable. Spleen: Unremarkable. Adrenals/Urinary Tract: Negative adrenals. No hydronephrosis or stone. Unremarkable bladder. Stomach/Bowel: There is a 23 mm ovoid rim enhancing collection at the 5 o'clock position of the anus, deep to the sphincteric musculature. There is underlying proctitis with mucosal hyperenhancement and irregularity. Multiple enlarged perianal and perirectal lymph nodes, measuring up to 15 mm short axis. The remainder of the bowel shows no inflammation. Normal appendix. Vascular/Lymphatic: No acute vascular abnormality. Adenopathy described above. Reproductive:No pathologic findings. Other: No ascites or pneumoperitoneum. Musculoskeletal: No acute or aggressive finding. No evidence of spondyloarthropathy. IMPRESSION: 1. 23 mm perianal abscess at the 5 o'clock position. 2. Underlying proctitis with prominent mesenteric adenopathy. Recommend visualization or imaging follow-up to exclude malignancy. Electronically Signed   By: JMonte FantasiaM.D.   On: 03/29/2017 09:12   Dg Abd 2 Views  Result Date: 03/28/2017 CLINICAL DATA:  Recent anal sex. Abdominal pain and nausea for 3 days. EXAM: ABDOMEN - 2 VIEW COMPARISON:  Chest 09/08/2016 FINDINGS: Gas and stool throughout the colon. No small or large bowel distention. No free intra- abdominal air. No abnormal air-fluid levels. No radiopaque stones. Visualized bones appear intact. Soft tissue contours are unremarkable. IMPRESSION: Nonobstructive bowel gas pattern. Electronically Signed   By: WLucienne CapersM.D.   On: 03/28/2017 05:36    Review of Systems  Constitutional: Positive for chills and fever. Negative for diaphoresis, malaise/fatigue and weight loss.  HENT: Negative.   Eyes: Negative.   Respiratory: Negative.   Cardiovascular: Negative.   Gastrointestinal: Positive for abdominal pain, nausea and vomiting. Negative for blood in stool, constipation, diarrhea and melena.  Genitourinary: Positive  for hematuria. Negative for dysuria, flank pain, frequency and urgency.  Trouble voiding he says he is just dribbling, instead of a normal urine stream.  Musculoskeletal:       Complains of pain in both lower extremities from his trunk down to his feet. This is been present for the last 2 weeks.  Skin: Negative.   Neurological: Negative.  Negative for weakness.  Endo/Heme/Allergies: Negative.   Psychiatric/Behavioral: Positive for depression (See his psychiatrist at the [place].).   Blood pressure 131/90, pulse 76, temperature 98.2 F (36.8 C), temperature source Oral, resp. rate 16, SpO2 97 %. Physical Exam  Constitutional: He is oriented to person, place, and time. He appears well-developed and well-nourished. No distress.  He does feel febrile currently, oral temperature is only 98.8 but I don't believe it's correct.  HENT:  Head: Normocephalic and atraumatic.  Mouth/Throat: No oropharyngeal exudate.  Eyes: Right eye exhibits no discharge. Left eye exhibits no discharge. No scleral icterus.  Pupils are equal  Neck: Normal range of motion. Neck supple. No JVD present. No tracheal deviation present. No thyromegaly present.  Cardiovascular: Normal rate, regular rhythm, normal heart sounds and intact distal pulses.   No murmur heard. Respiratory: Effort normal and breath sounds normal. No respiratory distress. He has no wheezes. He has no rales. He exhibits no tenderness.  GI: Soft. Bowel sounds are normal. He exhibits no distension and no mass. There is no tenderness. There is no rebound and no guarding.  Genitourinary:     Genitourinary Comments: See picture above  Musculoskeletal: He exhibits no edema or tenderness.  Lymphadenopathy:    He has no cervical adenopathy.  Neurological: He is alert and oriented to person, place, and time. No cranial nerve deficit.  Skin: Skin is warm and dry. No rash noted. He is not diaphoretic. No erythema. No pallor.  Psychiatric: He has a normal  mood and affect. His behavior is normal. Judgment and thought content normal.    Assessment/Plan: Perirectal abscess, with nausea and vomiting Proctitis Dehydration  HIV - followed at the [place], -  Bilateral lower extremity numbness/neuropathy   hematuria Hypertension  Plan: Medicine to admit and place on IV antibiotics. He is allergic to penicillins is currently on Cipro and Flagyl IV. ID evaluation. Dr. gross will evaluate and decide whether he needs to be taken the operating room for exam under anesthesia and drainage of the abscess. I would keep him nothing by mouth until Dr. gross has an opportunity to evaluate and decide on surgery.  Donta Fuster 03/29/2017, 11:21 AM     numbness/neuropathy

## 2017-03-29 NOTE — ED Notes (Signed)
Pt up to restroom and attempted to have bowel movement. Pt states unable to have bowel movement and now in severe pain.

## 2017-03-30 ENCOUNTER — Observation Stay (HOSPITAL_COMMUNITY): Payer: Self-pay

## 2017-03-30 ENCOUNTER — Encounter (HOSPITAL_COMMUNITY): Payer: Self-pay | Admitting: General Surgery

## 2017-03-30 DIAGNOSIS — K6289 Other specified diseases of anus and rectum: Secondary | ICD-10-CM

## 2017-03-30 DIAGNOSIS — B20 Human immunodeficiency virus [HIV] disease: Secondary | ICD-10-CM

## 2017-03-30 DIAGNOSIS — R0902 Hypoxemia: Secondary | ICD-10-CM

## 2017-03-30 DIAGNOSIS — R042 Hemoptysis: Secondary | ICD-10-CM

## 2017-03-30 DIAGNOSIS — R319 Hematuria, unspecified: Secondary | ICD-10-CM

## 2017-03-30 DIAGNOSIS — K611 Rectal abscess: Secondary | ICD-10-CM

## 2017-03-30 DIAGNOSIS — Z880 Allergy status to penicillin: Secondary | ICD-10-CM

## 2017-03-30 DIAGNOSIS — R918 Other nonspecific abnormal finding of lung field: Secondary | ICD-10-CM

## 2017-03-30 DIAGNOSIS — Z9889 Other specified postprocedural states: Secondary | ICD-10-CM

## 2017-03-30 DIAGNOSIS — J9601 Acute respiratory failure with hypoxia: Secondary | ICD-10-CM

## 2017-03-30 LAB — BASIC METABOLIC PANEL
ANION GAP: 9 (ref 5–15)
BUN: 13 mg/dL (ref 6–20)
CALCIUM: 8.4 mg/dL — AB (ref 8.9–10.3)
CO2: 25 mmol/L (ref 22–32)
Chloride: 103 mmol/L (ref 101–111)
Creatinine, Ser: 1.16 mg/dL (ref 0.61–1.24)
GFR calc Af Amer: >60 mL/min (ref >60)
GLUCOSE: 115 mg/dL — AB (ref 65–99)
Potassium: 4.3 mmol/L (ref 3.5–5.1)
Sodium: 137 mmol/L (ref 135–145)

## 2017-03-30 LAB — BLOOD GAS, ARTERIAL
Acid-Base Excess: 1.8 mmol/L (ref 0.0–2.0)
Bicarbonate: 26.2 mmol/L (ref 20.0–28.0)
DRAWN BY: 295031
FIO2: 21
O2 SAT: 79.4 %
Patient temperature: 98.6
pCO2 arterial: 42.6 mmHg (ref 32.0–48.0)
pH, Arterial: 7.406 (ref 7.350–7.450)
pO2, Arterial: 43.8 mmHg — ABNORMAL LOW (ref 83.0–108.0)

## 2017-03-30 LAB — URINE CULTURE: Culture: NO GROWTH

## 2017-03-30 LAB — CBC
HCT: 43.6 % (ref 39.0–52.0)
HEMOGLOBIN: 15.1 g/dL (ref 13.0–17.0)
MCH: 30.3 pg (ref 26.0–34.0)
MCHC: 34.6 g/dL (ref 30.0–36.0)
MCV: 87.6 fL (ref 78.0–100.0)
Platelets: 192 10*3/uL (ref 150–400)
RBC: 4.98 MIL/uL (ref 4.22–5.81)
RDW: 12.6 % (ref 11.5–15.5)
WBC: 19.8 10*3/uL — ABNORMAL HIGH (ref 4.0–10.5)

## 2017-03-30 LAB — BRAIN NATRIURETIC PEPTIDE: B NATRIURETIC PEPTIDE 5: 68.8 pg/mL (ref 0.0–100.0)

## 2017-03-30 LAB — T-HELPER CELLS (CD4) COUNT (NOT AT ARMC)
CD4 T CELL ABS: 250 /uL — AB (ref 400–2700)
CD4 T CELL HELPER: 21 % — AB (ref 33–55)

## 2017-03-30 LAB — MRSA PCR SCREENING: MRSA BY PCR: NEGATIVE

## 2017-03-30 LAB — PROTIME-INR
INR: 1.07
Prothrombin Time: 13.9 seconds (ref 11.4–15.2)

## 2017-03-30 LAB — SEDIMENTATION RATE: SED RATE: 9 mm/h (ref 0–16)

## 2017-03-30 MED ORDER — PROMETHAZINE HCL 25 MG/ML IJ SOLN
12.5000 mg | Freq: Four times a day (QID) | INTRAMUSCULAR | Status: DC | PRN
  Administered 2017-03-30: 12.5 mg via INTRAVENOUS
  Filled 2017-03-30: qty 1

## 2017-03-30 MED ORDER — CIPROFLOXACIN HCL 500 MG PO TABS
500.0000 mg | ORAL_TABLET | Freq: Two times a day (BID) | ORAL | Status: DC
  Administered 2017-03-30 – 2017-03-31 (×3): 500 mg via ORAL
  Filled 2017-03-30 (×3): qty 1

## 2017-03-30 MED ORDER — FENTANYL CITRATE (PF) 100 MCG/2ML IJ SOLN
100.0000 ug | Freq: Once | INTRAMUSCULAR | Status: AC
  Filled 2017-03-30: qty 2

## 2017-03-30 MED ORDER — MIDAZOLAM HCL 2 MG/2ML IJ SOLN
5.0000 mg | Freq: Once | INTRAMUSCULAR | Status: AC
  Filled 2017-03-30: qty 6

## 2017-03-30 MED ORDER — MIDAZOLAM HCL 2 MG/2ML IJ SOLN
11.0000 mg | Freq: Once | INTRAMUSCULAR | Status: AC
  Administered 2017-03-30: 11 mg via INTRAVENOUS

## 2017-03-30 MED ORDER — MIDAZOLAM HCL 2 MG/2ML IJ SOLN
INTRAMUSCULAR | Status: AC
  Filled 2017-03-30: qty 4

## 2017-03-30 MED ORDER — PHENOL 1.4 % MT LIQD
2.0000 | OROMUCOSAL | Status: DC | PRN
  Filled 2017-03-30: qty 177

## 2017-03-30 MED ORDER — OXYCODONE HCL 5 MG PO TABS
5.0000 mg | ORAL_TABLET | ORAL | Status: DC | PRN
  Administered 2017-03-30 – 2017-04-01 (×2): 5 mg via ORAL
  Administered 2017-04-01: 10 mg via ORAL
  Filled 2017-03-30 (×3): qty 2
  Filled 2017-03-30: qty 1

## 2017-03-30 MED ORDER — FENTANYL CITRATE (PF) 100 MCG/2ML IJ SOLN
200.0000 ug | Freq: Once | INTRAMUSCULAR | Status: AC
  Administered 2017-03-30: 200 ug via INTRAVENOUS

## 2017-03-30 MED ORDER — LIDOCAINE HCL (PF) 1 % IJ SOLN
2.0000 mL | Freq: Once | INTRAMUSCULAR | Status: AC
  Administered 2017-03-30: 2 mL

## 2017-03-30 MED ORDER — METRONIDAZOLE 500 MG PO TABS
500.0000 mg | ORAL_TABLET | Freq: Three times a day (TID) | ORAL | Status: DC
  Administered 2017-03-30 – 2017-03-31 (×3): 500 mg via ORAL
  Filled 2017-03-30 (×3): qty 1

## 2017-03-30 MED ORDER — HYDROMORPHONE HCL 2 MG/ML IJ SOLN
1.0000 mg | INTRAMUSCULAR | Status: DC | PRN
  Administered 2017-03-30 – 2017-04-01 (×9): 1 mg via INTRAVENOUS
  Filled 2017-03-30 (×9): qty 1

## 2017-03-30 MED ORDER — LIP MEDEX EX OINT
1.0000 "application " | TOPICAL_OINTMENT | Freq: Two times a day (BID) | CUTANEOUS | Status: DC
  Administered 2017-03-30 – 2017-04-01 (×5): 1 via TOPICAL
  Filled 2017-03-30: qty 7

## 2017-03-30 MED ORDER — MAGIC MOUTHWASH
15.0000 mL | Freq: Four times a day (QID) | ORAL | Status: DC | PRN
  Filled 2017-03-30: qty 15

## 2017-03-30 MED ORDER — POLYETHYLENE GLYCOL 3350 17 G PO PACK
17.0000 g | PACK | Freq: Two times a day (BID) | ORAL | Status: DC | PRN
  Administered 2017-03-31: 17 g via ORAL
  Filled 2017-03-30: qty 1

## 2017-03-30 MED ORDER — MENTHOL 3 MG MT LOZG
1.0000 | LOZENGE | OROMUCOSAL | Status: DC | PRN
  Filled 2017-03-30: qty 9

## 2017-03-30 MED ORDER — FENTANYL CITRATE (PF) 100 MCG/2ML IJ SOLN
INTRAMUSCULAR | Status: AC
  Filled 2017-03-30: qty 4

## 2017-03-30 MED ORDER — PROMETHAZINE HCL 25 MG/ML IJ SOLN: 6.2500 mg | Freq: Four times a day (QID) | INTRAMUSCULAR | Status: DC | PRN

## 2017-03-30 MED ORDER — PHENOL 1.4 % MT LIQD
1.0000 | OROMUCOSAL | Status: DC | PRN
  Filled 2017-03-30: qty 177

## 2017-03-30 MED ORDER — POLYETHYLENE GLYCOL 3350 17 G PO PACK
17.0000 g | PACK | Freq: Two times a day (BID) | ORAL | Status: DC
  Administered 2017-03-30 – 2017-04-02 (×4): 17 g via ORAL
  Filled 2017-03-30 (×5): qty 1

## 2017-03-30 MED ORDER — METHOCARBAMOL 500 MG PO TABS: 750.0000 mg | ORAL_TABLET | Freq: Three times a day (TID) | ORAL | Status: DC | PRN

## 2017-03-30 MED ORDER — ALUM & MAG HYDROXIDE-SIMETH 200-200-20 MG/5ML PO SUSP
30.0000 mL | Freq: Four times a day (QID) | ORAL | Status: DC | PRN
  Administered 2017-03-31 – 2017-04-01 (×2): 30 mL via ORAL
  Filled 2017-03-30 (×2): qty 30

## 2017-03-30 MED ORDER — ENOXAPARIN SODIUM 40 MG/0.4ML ~~LOC~~ SOLN
40.0000 mg | SUBCUTANEOUS | Status: DC
  Administered 2017-03-30 – 2017-04-01 (×3): 40 mg via SUBCUTANEOUS
  Filled 2017-03-30 (×3): qty 0.4

## 2017-03-30 NOTE — Progress Notes (Signed)
Patient SpO2 62-72% on RA.  Patient alert, oriented, continues to have bloody productive cough, sweats, low grade temp at times.  Patient able to ambulate 1 lap in hallway, SpO2 remained in 70's.   Patient placed back on 4L O2, SpO2 91%, HR 120-130.  [place] notified, orders placed for CXR, ABG STAT.  Will continue to monitor.

## 2017-03-30 NOTE — Progress Notes (Signed)
eLink Physician-Brief Progress Note Patient Name: Danny Camacho DOB: 01-13-1991 MRN: [id number]   Date of Service  03/30/2017  HPI/Events of Note  Hemoptysis  hiv Rectal abscess  eICU Interventions  On ground team there, no eICU intervention     Intervention Category Evaluation Type: New Patient Evaluation  Max Fickle 03/30/2017, 4:22 PM

## 2017-03-30 NOTE — Consult Note (Signed)
Name: Danny Camacho MRN: [id number] DOB: 05-06-1991    ADMISSION DATE:  03/29/2017 CONSULTATION DATE:  03/30/17  REFERRING MD :  Dr. Luciana Axe / ID  CHIEF COMPLAINT:  Possible PCP PNA    HISTORY OF PRESENT ILLNESS:  26 y/o M admitted 4/19 with rectal pain.  He carries a [facility] (reports [facility] with all medications).  He was recently see in the [facility] office with reports of rectal pain after anal sex.  He was treated for proctitis.  He returned 4/19 with reports of inability to have a BM.  He also reported difficulty with voiding.  Initial evaluation included a UA with TNTC RBC's, FOBT was negative.  CT of the abdomen/pelvis showed a 23 mm perianal abscess and underlying proctitis with prominent mesenteric adenopathy.  He was admitted per [facility] for further evaluation and IV antibiotics.  Initial labs - Na 139, K 3.8, glucose 102, BUN 12 / Sr Cr 1.16, AG 9, WBC 9.7, Hgb 15.6 and platelets 202.  CD4% 21 / Abs CD4 250.  He was taken to surgery on 4/19 PM for drainage of abscess.  Anesthesia notes reflect "he developed laryngospasm after removal of LMA and required succinylcholine for ventilation.  He subsequently developed negative pressure pulmonary edema with improvement".  The patient was found to have an abnormal CXR with bilateral R>L airspace disease after surgery.  He reports immediately after, he developed cough with bloody sputum production and shortness of breath.  CT of the chest showed extensive patchy RUL, LLL, RLL consolidation with air bronchograms. He also developed a new O2 need of 4L.    PCCM consulted for evaluation of hypoxia and abnormal CT chest.  PAST MEDICAL HISTORY :   has a past medical history of HIV (human immunodeficiency virus infection) (HCC) and Hypertension.   has a past surgical history that includes Tonsillectomy; Fracture surgery; and Incision and drainage perirectal abscess (N/A, 03/29/2017).  Prior to Admission medications   Medication  Sig Start Date End Date Taking? Authorizing Provider  dolutegravir (TIVICAY) 50 MG tablet Take 50 mg by mouth daily.   Yes Historical Provider, MD  emtricitabine-tenofovir AF (DESCOVY) 200-25 MG tablet Take 1 tablet by mouth daily.   Yes Historical Provider, MD  fluticasone (FLONASE) 50 MCG/ACT nasal spray Place 2 sprays into both nostrils daily as needed for allergies.    Yes Historical Provider, MD  ibuprofen (ADVIL,MOTRIN) 200 MG tablet Take 400 mg by mouth every 6 (six) hours as needed for fever or headache.   Yes Historical Provider, MD  lisinopril (PRINIVIL,ZESTRIL) 20 MG tablet Take 1 tablet (20 mg total) by mouth daily. Patient taking differently: Take 10 mg by mouth daily.  11/29/15  Yes Mady Gemma, PA-C  loratadine (CLARITIN) 10 MG tablet Take 10 mg by mouth daily.   Yes Historical Provider, MD  traMADol (ULTRAM) 50 MG tablet Take 1 tablet (50 mg total) by mouth every 12 (twelve) hours as needed. 03/28/17  Yes Tomasita Crumble, MD  acetaminophen (TYLENOL) 500 MG tablet Take 1 tablet (500 mg total) by mouth every 6 (six) hours as needed. Patient not taking: Reported on 03/29/2017 10/28/16   Joni Reining Pisciotta, PA-C  benzonatate (TESSALON) 100 MG capsule Take 1 capsule (100 mg total) by mouth every 8 (eight) hours. Patient not taking: Reported on 03/29/2017 09/08/16   Rise Mu, PA-C  hydrocortisone (ANUSOL-HC) 2.5 % rectal cream Apply rectally 2 times daily Patient not taking: Reported on 03/29/2017 03/28/17  Earley Favor, NP  ibuprofen (ADVIL,MOTRIN) 600 MG tablet Take 1 tablet (600 mg total) by mouth every 6 (six) hours as needed. Patient not taking: Reported on 04/28/2016 08/13/15   Lyndal Pulley, MD  ondansetron (ZOFRAN ODT) 4 MG disintegrating tablet Take 1 tablet (4 mg total) by mouth every 8 (eight) hours as needed for nausea or vomiting. Patient not taking: Reported on 09/08/2016 04/29/16   Elpidio Anis, PA-C  ondansetron (ZOFRAN ODT) 4 MG disintegrating tablet Take 1 tablet (4  mg total) by mouth every 8 (eight) hours as needed for nausea or vomiting. Patient not taking: Reported on 03/29/2017 03/28/17   Earley Favor, NP    Allergies  Allergen Reactions  . Penicillins     Unknown Has patient had a PCN reaction causing immediate rash, facial/tongue/throat swelling, SOB or lightheadedness with hypotension: No Has patient had a PCN reaction causing severe rash involving mucus membranes or skin necrosis: No Has patient had a PCN reaction that required hospitalization No Has patient had a PCN reaction occurring within the last 10 years: No If all of the above answers are "NO", then may proceed with Cephalosporin use.     FAMILY HISTORY:  family history is not on file.  SOCIAL HISTORY:  reports that he has never smoked. He has never used smokeless tobacco. He reports that he uses drugs, including Marijuana. He reports that he does not drink alcohol.  REVIEW OF SYSTEMS:  POSITIVES IN BOLD  Constitutional: Negative for fever, chills, weight loss, malaise/fatigue and diaphoresis.  HENT: Negative for hearing loss, ear pain, nosebleeds, congestion, sore throat, neck pain, tinnitus and ear discharge.   Eyes: Negative for blurred vision, double vision, photophobia, pain, discharge and redness.  Respiratory: Negative for cough, hemoptysis, sputum production, shortness of breath, wheezing and stridor.   Cardiovascular: Negative for chest pain, palpitations, orthopnea, claudication, leg swelling and PND.  Gastrointestinal: Negative for heartburn, nausea, vomiting, abdominal pain, diarrhea, constipation, blood in stool and melena.  Genitourinary: Negative for dysuria, urgency, frequency, hematuria and flank pain. Rectal pain  Musculoskeletal: Negative for myalgias, back pain, joint pain and falls.  Skin: Negative for itching and rash.  Neurological: Negative for dizziness, tingling, tremors, sensory change, speech change, focal weakness, seizures, loss of consciousness,  weakness and headaches.  Endo/Heme/Allergies: Negative for environmental allergies and polydipsia. Does not bruise/bleed easily.  SUBJECTIVE:   VITAL SIGNS: Temp:  [98 F (36.7 C)-100.9 F (38.3 C)] 98.9 F (37.2 C) (04/20 1125) Pulse Rate:  [77-132] 130 (04/20 1125) Resp:  [15-24] 18 (04/20 1125) BP: (113-162)/(62-102) 139/66 (04/20 1125) SpO2:  [88 %-100 %] 91 % (04/20 1125) Weight:  [197 lb (89.4 kg)] 197 lb (89.4 kg) (04/19 1645)  PHYSICAL EXAMINATION: General:  Well developed young adult male in NAD HEENT: MM pink/moist, no jvd PSY: calm / appropriate Neuro: AAOx4, speech clear, MAE  CV: s1s2 rrr, no m/r/g PULM: even/non-labored, lungs bilaterally with inspiratory crackles ZO:XWRU, non-tender, bsx4 active  Extremities: warm/dry, no edema  Skin: no rashes or lesions.  Few small dark circular scars on BLE's    Recent Labs Lab 03/28/17 0502 03/29/17 0718 03/30/17 0432  NA 139 138 137  K 4.0 3.8 4.3  CL 107 105 103  CO2 BUN CREATININE 1.28* 1.16 1.16  GLUCOSE 122* 102* 115*     Recent Labs Lab 03/28/17 0502 03/29/17 0718 03/30/17 0432  HGB 15.6 15.9 15.1  HCT 45.2 45.4 43.6  WBC 9.7 14.9* 19.8*  PLT  202 202 192    Dg Chest 2 View  Result Date: 03/30/2017 CLINICAL DATA:  Low oxygen saturation with Short of breath.  HIV EXAM: CHEST  2 VIEW COMPARISON:  09/08/2016 FINDINGS: Extensive airspace disease throughout the right lung and in the left lower lobe. No significant pleural effusion. Mild cardiac enlargement. Lungs were croup clear previously. No acute osseous abnormality. IMPRESSION: Extensive airspace disease throughout the right lung and in the left lower lobe most likely multi lobar pneumonia. Electronically Signed   By: Marlan Palau M.D.   On: 03/30/2017 12:34   Ct Abdomen Pelvis W Contrast  Result Date: 03/29/2017 CLINICAL DATA:  Left lower quadrant pain. EXAM: CT ABDOMEN AND PELVIS WITH CONTRAST TECHNIQUE: Multidetector CT  imaging of the abdomen and pelvis was performed using the standard protocol following bolus administration of intravenous contrast. CONTRAST:  ISOVUE-300 IOPAMIDOL (ISOVUE-300) INJECTION 61% COMPARISON:  None. FINDINGS: Lower chest:  No contributory findings. Hepatobiliary: Subcentimeter low-density in the high right liver, too small for densitometry, statistically a cyst.No evidence of biliary obstruction or stone. Pancreas: Unremarkable. Spleen: Unremarkable. Adrenals/Urinary Tract: Negative adrenals. No hydronephrosis or stone. Unremarkable bladder. Stomach/Bowel: There is a 23 mm ovoid rim enhancing collection at the 5 o'clock position of the anus, deep to the sphincteric musculature. There is underlying proctitis with mucosal hyperenhancement and irregularity. Multiple enlarged perianal and perirectal lymph nodes, measuring up to 15 mm short axis. The remainder of the bowel shows no inflammation. Normal appendix. Vascular/Lymphatic: No acute vascular abnormality. Adenopathy described above. Reproductive:No pathologic findings. Other: No ascites or pneumoperitoneum. Musculoskeletal: No acute or aggressive finding. No evidence of spondyloarthropathy. IMPRESSION: 1. 23 mm perianal abscess at the 5 o'clock position. 2. Underlying proctitis with prominent mesenteric adenopathy. Recommend visualization or imaging follow-up to exclude malignancy. Electronically Signed   By: Marnee Spring M.D.   On: 03/29/2017 09:12      SIGNIFICANT EVENTS  4/19  Admit with rectal abscess, to OR.  Negative pressure pulmonary edema 4/20  Bilateral infiltrates, new O2 need.  PCCM consulted.    STUDIES:  CT ABD/Pelvis 4/19 >> 23 mm perianal abscess and underlying proctitis with prominent mesenteric adenopathy. CT Chest 4/19 >> extensive patchy RUL, LLL, RLL consolidation with air bronchograms   ASSESSMENT / PLAN:  Discussion:  26 y/o HIV positive male admitted on 4/19 with rectal abscess.  Went to OR for abscess  drainage > post extubation developed laryngeal spasm requiring medication to ventilate with pulmonary edema.  Post operatively, he developed cough with hemoptysis, new O2 requirement and bilateral infiltrates.    1.  Diffuse Bilateral Pulmonary Infiltrates, R>L - suspect related to respiratory difficulties post extubation as he developed laryngeal spasm and was difficult to ventilate.  However, differential ddx also includes aspiration, DAH, fractured capillary syndrome, infectious, viral and autoimmune (mother has a hx of lupus).    2.  Acute Hypoxic Respiratory Failure - secondary to above / #1  3.  Perirectal Abscess   Plan: Transfer to ICU for FOB  Assess bronchial washings for culture, PCP, cytology , AFB/fungal  O2 for sats > 92%  Pulmonary hygiene - IS, mobilize  Intermittent CXR  Monitor in ICU overnight and will likely be able to transfer back out in am.  Transferred for staffing / procedure only.     Canary Brim, NP-C [facility] Pgr: [phone] or if no answer [phone] 03/30/2017, 3:01 PM

## 2017-03-30 NOTE — Procedures (Signed)
Bronchoscopy Procedure Note Danny Camacho [id number] 19-Jun-1991  Procedure: Bronchoscopy Indications: Obtain specimens for culture and/or other diagnostic studies   26 yo male with history of HIV developed hypoxia, hemoptysis, and pulmonary infiltrates.  Procedure done in ICU room.  Placed on 6 liters oxygen.  Nebulized lidocaine administered.  Given 10 mg versed, 200 mcg fentanyl, 10 ml of 1% lidocaine.  Conscious sedation time 28 minutes.  Bronchoscope entered orally.  Vocal cords visualized.  Noted to have paradoxical closing of vocal cords, making it difficult to pass bronchoscope.    Eventually able to pass bronchoscope.  Quick inspection of airways did not show any significant endobronchial lesions, but inspection time was limited due to patient coughing and moving in the bed.    Bronchoscope wedged into Rt lower lobe bronchus.  Instilled 20 ml of saline with 10 ml of clear fluid returned.    Bronchoscope then withdrawn.  No immediate complications.  Hemodynamics, and oxygenation stable during the procedure.  Disposition: Monitor in ICU overnight Will send quantitative BAL, AFB, and fungal culture Will send BAL for cytology Follow up chest xray in AM Advanced diet as tolerated after he recovers from conscious sedation  Updated patient's mother at bedside.  Chesley Mires, MD [facility] Pulmonary/Critical Care 03/30/2017, 5:23 PM Pager:  [phone] After 3pm call: [phone]

## 2017-03-30 NOTE — Consult Note (Addendum)
[name] for Infectious Disease       Reason for Consult: HIV    Referring Physician: Dr. Marlou Starks  Principal Problem:   Perirectal abscess s/p I&D 03/29/2017 Active Problems:   HIV (human immunodeficiency virus infection) ([place])   Hypertension   Proctitis   . acetaminophen  1,000 mg Oral TID  . chlorhexidine  15 mL Mouth Rinse BID  . Chlorhexidine Gluconate Cloth  6 each Topical Once  . ciprofloxacin  500 mg Oral BID  . dolutegravir  50 mg Oral Daily  . emtricitabine-tenofovir AF  1 tablet Oral Daily  . enoxaparin (LOVENOX) injection  40 mg Subcutaneous Q24H  . lip balm  1 application Topical BID  . lisinopril  20 mg Oral Daily  . loratadine  10 mg Oral Daily  . mouth rinse  15 mL Mouth Rinse q12n4p  . metroNIDAZOLE  500 mg Oral Q8H  . polyethylene glycol  17 g Oral BID    Recommendations: Continue Tivicay and Descovy Ct chest Pulmonary consultation CD4, viral load esr  Assessment: He has a perirectal abscess with proctitis and on appropriate therapy. He also is hypoxic on ABG, requires oxygen, CXR with multifocal disease and hemoptysis of unknown etiology.  Sputum has been ordered but I think BAL may help get the answer sooner.  Differential is broad and may be infectious, autoimmune, hemorrhage?Marland Kitchen   Also with hematuria.  possibly Strep-related? Goodpastures? Allergy to penicillin.  Mother remembers rash, some sob.     Antibiotics: cipro/flagyl  HPI: Danny Camacho is a 26 y.o. male with a history of HIV reported by him to be well controlled with Tivicay and Descovy with a CD4 he thinks was around 800 and a CD4 here in September 2017 of 430 who came in with rectal pain after rectal sex and has a perirectal abscess, now s/p drainage.  No fever, no chills.   CXR independently reviewed and multifocal pneumonia noted.    Review of Systems:  Constitutional: positive for fevers and chills Respiratory: positive for sputum, hemoptysis or dyspnea on exertion, negative  for pleurisy/chest pain Gastrointestinal: negative for diarrhea Integument/breast: negative for rash All other systems reviewed and are negative    Past Medical History:  Diagnosis Date  . HIV (human immunodeficiency virus infection) ([place])   . Hypertension     Social History  Substance Use Topics  . Smoking status: Never Smoker  . Smokeless tobacco: Never Used  . Alcohol use No    History reviewed. No pertinent family history.  Allergies  Allergen Reactions  . Penicillins     Unknown Has patient had a PCN reaction causing immediate rash, facial/tongue/throat swelling, SOB or lightheadedness with hypotension: No Has patient had a PCN reaction causing severe rash involving mucus membranes or skin necrosis: No Has patient had a PCN reaction that required hospitalization No Has patient had a PCN reaction occurring within the last 10 years: No If all of the above answers are "NO", then may proceed with Cephalosporin use.     Physical Exam: Constitutional: in no apparent distress and alert  Vitals:   03/30/17 0428 03/30/17 1125  BP: 138/85 139/66  Pulse: 89 (!) 130  Resp: 16 18  Temp: 98.4 F (36.9 C) 98.9 F (37.2 C)   EYES: anicteric ENMT: no thrush Cardiovascular: Cor RRR Respiratory: CTA B; normal respiratory effort GI: Bowel sounds are normal, liver is not enlarged, spleen is not enlarged, soft, nt Musculoskeletal: no pedal edema noted Skin: negatives: no rash Hematologic:  no cervical lad  Lab Results  Component Value Date   WBC 19.8 (H) 03/30/2017   HGB 15.1 03/30/2017   HCT 43.6 03/30/2017   MCV 87.6 03/30/2017   PLT 192 03/30/2017    Lab Results  Component Value Date   CREATININE 1.16 03/30/2017   BUN 13 03/30/2017   NA 137 03/30/2017   K 4.3 03/30/2017   CL 103 03/30/2017   CO2 25 03/30/2017    Lab Results  Component Value Date   ALT 18 03/29/2017   AST 18 03/29/2017   ALKPHOS 81 03/29/2017     Microbiology: Recent Results (from the past  240 hour(s))  Urine culture     Status: None   Collection Time: 03/29/17  7:02 AM  Result Value Ref Range Status   Specimen Description URINE, RANDOM  Final   Special Requests NONE  Final   Culture   Final    NO GROWTH Performed at [facility], [place] [address] [place], [place] [address]    Report Status 03/30/2017 FINAL  Final  Surgical pcr screen     Status: None   Collection Time: 03/29/17  3:47 PM  Result Value Ref Range Status   MRSA, PCR NEGATIVE NEGATIVE Final   Staphylococcus aureus NEGATIVE NEGATIVE Final    Comment:        The Xpert SA Assay (FDA approved for NASAL specimens in patients over 42 years of age), is one component of a comprehensive surveillance program.  Test performance has been validated by [facility] for patients greater than or equal to 65 year old. It is not intended to diagnose infection nor to guide or monitor treatment.   Aerobic Culture (superficial specimen)     Status: None (Preliminary result)   Collection Time: 03/29/17  6:22 PM  Result Value Ref Range Status   Specimen Description ABSCESS RECTAL  Final   Special Requests NONE  Final   Gram Stain   Final    ABUNDANT WBC PRESENT,BOTH PMN AND MONONUCLEAR MODERATE GRAM POSITIVE COCCI IN PAIRS RARE GRAM NEGATIVE RODS Performed at [facility], [address] [place], [place] [address]    Culture PENDING  Incomplete   Report Status PENDING  Incomplete    Harjot Dibello, Herbie [place], [place] [facility] for Infectious Disease [facility] [email or web address] [phone] pager  [phone] cell 03/30/2017, 12:03 PM

## 2017-03-30 NOTE — Progress Notes (Signed)
Bedside bronch performed to obtain BAL. No complications noted during procedure. VSS throughout. RT will assess as needed.

## 2017-03-30 NOTE — Progress Notes (Signed)
1 Day Post-Op  Subjective: Chief complaint: Rectal pain  He doesn't want to move secondary to the pain discomfort. Dressing in place. No hematuria He says he is spitting up some red stuff and has it in the urinal.  It looks like [place] to me.     Objective: Vital signs in last 24 hours: Temp:  [98 F (36.7 C)-100.9 F (38.3 C)] 98.4 F (36.9 C) (04/20 0428) Pulse Rate:  [76-132] 89 (04/20 0428) Resp:  [15-24] 16 (04/20 0428) BP: (113-162)/(62-102) 138/85 (04/20 0428) SpO2:  [88 %-100 %] 98 % (04/20 0428) Weight:  [89.4 kg (197 lb)] 89.4 kg (197 lb) (04/19 1645) Last BM Date: 03/27/17 1533 IV 400 urine recorded WBC 19.8  H/H 15/44    Intake/Output from previous day: 04/19 0701 - 04/20 0700 In: 1533.3 [I.V.:1233.3; IV Piggyback:300] Out: 405 [Urine:400; Blood:5] Intake/Output this shift: No intake/output data recorded.  General appearance: alert, cooperative and no distress Resp: clear to auscultation bilaterally GI: soft, non-tender; bowel sounds normal; no masses,  no organomegaly and Packings in place & is dry., no excessive drainage.  Lab Results:   Recent Labs  03/29/17 0718 03/30/17 0432  WBC 14.9* 19.8*  HGB 15.9 15.1  HCT 45.4 43.6  PLT 202 192    BMET  Recent Labs  03/29/17 0718 03/30/17 0432  NA 138 137  K 3.8 4.3  CL 105 103  CO2 24 25  GLUCOSE 102* 115*  BUN 12 13  CREATININE 1.16 1.16  CALCIUM 9.3 8.4*   PT/INR No results for input(s): LABPROT, INR in the last 72 hours.   Recent Labs Lab 03/28/17 0502 03/29/17 0718  AST 21 18  ALT 18 18  ALKPHOS 70 81  BILITOT 0.9 1.6*  PROT 6.8 8.1  ALBUMIN 4.0 4.3     Lipase     Component Value Date/Time   LIPASE 13 03/29/2017 0718     Studies/Results: Ct Abdomen Pelvis W Contrast  Result Date: 03/29/2017 CLINICAL DATA:  Left lower quadrant pain. EXAM: CT ABDOMEN AND PELVIS WITH CONTRAST TECHNIQUE: Multidetector CT imaging of the abdomen and pelvis was performed using the  standard protocol following bolus administration of intravenous contrast. CONTRAST:  ISOVUE-300 IOPAMIDOL (ISOVUE-300) INJECTION 61% COMPARISON:  None. FINDINGS: Lower chest:  No contributory findings. Hepatobiliary: Subcentimeter low-density in the high right liver, too small for densitometry, statistically a cyst.No evidence of biliary obstruction or stone. Pancreas: Unremarkable. Spleen: Unremarkable. Adrenals/Urinary Tract: Negative adrenals. No hydronephrosis or stone. Unremarkable bladder. Stomach/Bowel: There is a 23 mm ovoid rim enhancing collection at the 5 o'clock position of the anus, deep to the sphincteric musculature. There is underlying proctitis with mucosal hyperenhancement and irregularity. Multiple enlarged perianal and perirectal lymph nodes, measuring up to 15 mm short axis. The remainder of the bowel shows no inflammation. Normal appendix. Vascular/Lymphatic: No acute vascular abnormality. Adenopathy described above. Reproductive:No pathologic findings. Other: No ascites or pneumoperitoneum. Musculoskeletal: No acute or aggressive finding. No evidence of spondyloarthropathy. IMPRESSION: 1. 23 mm perianal abscess at the 5 o'clock position. 2. Underlying proctitis with prominent mesenteric adenopathy. Recommend visualization or imaging follow-up to exclude malignancy. Electronically Signed   By: Marnee Spring M.D.   On: 03/29/2017 09:12   Prior to Admission medications   Medication Sig Start Date End Date Taking? Authorizing Provider  dolutegravir (TIVICAY) 50 MG tablet Take 50 mg by mouth daily.   Yes Historical Provider, MD  emtricitabine-tenofovir AF (DESCOVY) 200-25 MG tablet Take 1 tablet by mouth daily.  Yes Historical Provider, MD  fluticasone (FLONASE) 50 MCG/ACT nasal spray Place 2 sprays into both nostrils daily as needed for allergies.    Yes Historical Provider, MD  ibuprofen (ADVIL,MOTRIN) 200 MG tablet Take 400 mg by mouth every 6 (six) hours as needed for fever or  headache.   Yes Historical Provider, MD  lisinopril (PRINIVIL,ZESTRIL) 20 MG tablet Take 1 tablet (20 mg total) by mouth daily. Patient taking differently: Take 10 mg by mouth daily.  11/29/15  Yes Mady Gemma, PA-C  loratadine (CLARITIN) 10 MG tablet Take 10 mg by mouth daily.   Yes Historical Provider, MD  traMADol (ULTRAM) 50 MG tablet Take 1 tablet (50 mg total) by mouth every 12 (twelve) hours as needed. 03/28/17  Yes Tomasita Crumble, MD  acetaminophen (TYLENOL) 500 MG tablet Take 1 tablet (500 mg total) by mouth every 6 (six) hours as needed. Patient not taking: Reported on 03/29/2017 10/28/16   Joni Reining Pisciotta, PA-C  benzonatate (TESSALON) 100 MG capsule Take 1 capsule (100 mg total) by mouth every 8 (eight) hours. Patient not taking: Reported on 03/29/2017 09/08/16   Rise Mu, PA-C  hydrocortisone (ANUSOL-HC) 2.5 % rectal cream Apply rectally 2 times daily Patient not taking: Reported on 03/29/2017 03/28/17   Earley Favor, NP  ibuprofen (ADVIL,MOTRIN) 600 MG tablet Take 1 tablet (600 mg total) by mouth every 6 (six) hours as needed. Patient not taking: Reported on 04/28/2016 08/13/15   Lyndal Pulley, MD  ondansetron (ZOFRAN ODT) 4 MG disintegrating tablet Take 1 tablet (4 mg total) by mouth every 8 (eight) hours as needed for nausea or vomiting. Patient not taking: Reported on 09/08/2016 04/29/16   Elpidio Anis, PA-C  ondansetron (ZOFRAN ODT) 4 MG disintegrating tablet Take 1 tablet (4 mg total) by mouth every 8 (eight) hours as needed for nausea or vomiting. Patient not taking: Reported on 03/29/2017 03/28/17   Earley Favor, NP     Medications: . acetaminophen  1,000 mg Oral TID  . chlorhexidine  15 mL Mouth Rinse BID  . Chlorhexidine Gluconate Cloth  6 each Topical Once  . dolutegravir  50 mg Oral Daily  . emtricitabine-tenofovir AF  1 tablet Oral Daily  . lisinopril  20 mg Oral Daily  . loratadine  10 mg Oral Daily  . mouth rinse  15 mL Mouth Rinse q12n4p   . 0.9 % NaCl with  KCl 20 mEq / L 125 mL/hr at 03/30/17 0408  . ciprofloxacin Stopped (03/30/17 0055)  . methocarbamol (ROBAXIN)  IV 1,000 mg (03/30/17 0733)  . metronidazole Stopped (03/30/17 0456)    Assessment/Plan Rectal abscess S/P exam under anesthesia, irrigation and debridement of rectal abscess, 03/29/17 Dr. Renae Fickle TothIII Proctitis Dehydration  HIV - followed at the [facility], -  Bilateral lower extremity numbness/neuropathy   hematuria Hypertension ID:  [id number] Cipro/1000 Flagyl - resume pre admit HIV antivirals Continue  Cipro/Flagyl PO FEN: IV fluids/clears =>> regular diet DVT:  Lovenox this PM   Plan:  Mobilize, Sitz bath, when the packing comes out in sitz bath or with BM it can stay out.  He needs to do sitz bath/shower  after BM to clean site.  Continue PO antibiotics.  I have called ID and ask them to see also.    I am going to leave IV going same rate for now and be sure hematuria is resolved and he's voiding normally before he goes.       Nurse called and O2 saturations are down.  I will  get stat CXR and ABG.    LOS: 0 days    Shauntia Levengood 03/30/2017 [phone]

## 2017-03-31 ENCOUNTER — Inpatient Hospital Stay (HOSPITAL_COMMUNITY): Payer: Self-pay

## 2017-03-31 DIAGNOSIS — B950 Streptococcus, group A, as the cause of diseases classified elsewhere: Secondary | ICD-10-CM

## 2017-03-31 LAB — BASIC METABOLIC PANEL
ANION GAP: 8 (ref 5–15)
BUN: 13 mg/dL (ref 6–20)
CO2: 27 mmol/L (ref 22–32)
Calcium: 8.3 mg/dL — ABNORMAL LOW (ref 8.9–10.3)
Chloride: 102 mmol/L (ref 101–111)
Creatinine, Ser: 1.12 mg/dL (ref 0.61–1.24)
Glucose, Bld: 98 mg/dL (ref 65–99)
POTASSIUM: 3.8 mmol/L (ref 3.5–5.1)
Sodium: 137 mmol/L (ref 135–145)

## 2017-03-31 LAB — CBC
HCT: 42.6 % (ref 39.0–52.0)
Hemoglobin: 14.4 g/dL (ref 13.0–17.0)
MCH: 29.9 pg (ref 26.0–34.0)
MCHC: 33.8 g/dL (ref 30.0–36.0)
MCV: 88.4 fL (ref 78.0–100.0)
Platelets: 194 10*3/uL (ref 150–400)
RBC: 4.82 MIL/uL (ref 4.22–5.81)
RDW: 12.7 % (ref 11.5–15.5)
WBC: 11.4 10*3/uL — ABNORMAL HIGH (ref 4.0–10.5)

## 2017-03-31 LAB — GLUCOSE, CAPILLARY: GLUCOSE-CAPILLARY: 109 mg/dL — AB (ref 65–99)

## 2017-03-31 LAB — ANA W/REFLEX IF POSITIVE: Anti Nuclear Antibody(ANA): NEGATIVE

## 2017-03-31 LAB — ACID FAST SMEAR (AFB, MYCOBACTERIA): Acid Fast Smear: NEGATIVE

## 2017-03-31 MED ORDER — BISACODYL 5 MG PO TBEC
10.0000 mg | DELAYED_RELEASE_TABLET | Freq: Once | ORAL | Status: AC
  Administered 2017-03-31: 10 mg via ORAL
  Filled 2017-03-31: qty 2

## 2017-03-31 MED ORDER — FUROSEMIDE 10 MG/ML IJ SOLN
40.0000 mg | Freq: Once | INTRAMUSCULAR | Status: AC
  Administered 2017-03-31: 40 mg via INTRAVENOUS
  Filled 2017-03-31: qty 4

## 2017-03-31 MED ORDER — VITAMINS A & D EX OINT
TOPICAL_OINTMENT | CUTANEOUS | Status: AC
  Filled 2017-03-31: qty 5

## 2017-03-31 MED ORDER — DOCUSATE SODIUM 100 MG PO CAPS
100.0000 mg | ORAL_CAPSULE | Freq: Two times a day (BID) | ORAL | Status: DC
  Administered 2017-03-31 – 2017-04-02 (×3): 100 mg via ORAL
  Filled 2017-03-31 (×4): qty 1

## 2017-03-31 MED ORDER — CLINDAMYCIN HCL 300 MG PO CAPS
300.0000 mg | ORAL_CAPSULE | Freq: Three times a day (TID) | ORAL | Status: DC
  Administered 2017-03-31 – 2017-04-02 (×6): 300 mg via ORAL
  Filled 2017-03-31 (×7): qty 1

## 2017-03-31 NOTE — Progress Notes (Signed)
C/o pleuritic chest pain Rt > Lt.  No cough.  Still doesn't feel like breathing is at baseline.  BP (!) 146/87 (BP Location: Right Arm)   Pulse 78   Temp 98 F (36.7 C) (Oral)   Resp 20   Ht 5' 9.5" (1.765 m)   Wt 194 lb 7.1 oz (88.2 kg)   SpO2 99%   BMI 28.30 kg/m   I/O last 3 completed shifts: In: 2133.3 [P.O.:480; I.V.:1353.3; IV Piggyback:300] Out: 1100 [Urine:1100]  Pleasant, no stridor, HR regular, faint b/l crackles, no wheeze, Abd soft, no edema.   CMP Latest Ref Rng & Units 03/31/2017 03/30/2017 03/29/2017  Glucose 65 - 99 mg/dL 98 115(H) 102(H)  BUN 6 - 20 mg/dL _0 Creatinine 0.61 - 1.24 mg/dL 1.12 1.16 1.16  Sodium 135 - 145 mmol/L 137 137 138  Potassium 3.5 - 5.1 mmol/L 3.8 4.3 3.8  Chloride 101 - 111 mmol/L 102 103 105  CO2 22 - 32 mmol/L _1 Calcium 8.9 - 10.3 mg/dL 8.3(L) 8.4(L) 9.3  Total Protein 6.5 - 8.1 g/dL - - 8.1  Total Bilirubin 0.3 - 1.2 mg/dL - - 1.6(H)  Alkaline Phos 38 - 126 U/L - - 81  AST 15 - 41 U/L - - 18  ALT 17 - 63 U/L - - 18    CBC Latest Ref Rng & Units 03/31/2017 03/30/2017 03/29/2017  WBC 4.0 - 10.5 K/uL 11.4(H) 19.8(H) 14.9(H)  Hemoglobin 13.0 - 17.0 g/dL 14.4 15.1 15.9  Hematocrit 39.0 - 52.0 % 42.6 43.6 45.4  Platelets 150 - 400 K/uL 194 192 202    Dg Chest 2 View  Result Date: 03/30/2017 CLINICAL DATA:  Low oxygen saturation with Short of breath.  HIV EXAM: CHEST  2 VIEW COMPARISON:  09/08/2016 FINDINGS: Extensive airspace disease throughout the right lung and in the left lower lobe. No significant pleural effusion. Mild cardiac enlargement. Lungs were croup clear previously. No acute osseous abnormality. IMPRESSION: Extensive airspace disease throughout the right lung and in the left lower lobe most likely multi lobar pneumonia. Electronically Signed   By: Franchot Gallo M.D.   On: 03/30/2017 12:34   Ct Chest Wo Contrast  Result Date: 03/30/2017 CLINICAL DATA:  Hemoptysis and dyspnea.  Inpatient.  HIV positive. EXAM:  CT CHEST WITHOUT CONTRAST TECHNIQUE: Multidetector CT imaging of the chest was performed following the standard protocol without IV contrast. COMPARISON:  Chest radiograph from earlier today. FINDINGS: Cardiovascular: Normal heart size. Trace pericardial effusion/ thickening. Great vessels are normal in course and caliber. Mediastinum/Nodes: No discrete thyroid nodules. Unremarkable esophagus. Mild bilateral axillary lymphadenopathy measuring up to 1.2 cm on the right (series 2/ image 52) and 1.2 cm on the left (series 2/ image 68). No pathologically enlarged mediastinal or gross hilar nodes on this noncontrast scan. Lungs/Pleura: No pneumothorax. No pleural effusion. Extensive patchy consolidation with air bronchograms throughout the right upper lobe and left greater than right lower lobes. Extensive patchy ground-glass centrilobular nodularity throughout both lungs involving all lung lobes. Upper abdomen: Unremarkable. Musculoskeletal:  No aggressive appearing focal osseous lesions. IMPRESSION: 1. Extensive patchy right upper lobe and left greater than right lower lobe consolidation with air bronchograms. Patchy confluent ground-glass centrilobular nodularity throughout both lung lobes. Differential diagnosis includes multilobar pneumonia or diffuse alveolar hemorrhage. 2. Symmetric mild axillary adenopathy, probably reactive from HIV. 3. Trace pericardial effusion/thickening.  No pleural effusions. Electronically Signed   By: Ilona Sorrel M.D.   On: 03/30/2017 15:00  Dg Chest Port 1 View  Result Date: 03/31/2017 CLINICAL DATA:  Pulmonary infiltrates EXAM: PORTABLE CHEST 1 VIEW COMPARISON:  Chest radiographs and CT 03/30/2017 FINDINGS: The cardiac silhouette remains mildly enlarged. Dense right upper lobe consolidation as well as more patchy but still extensive airspace opacities in the lung bases bilaterally do not appear significantly changed. No sizable pleural effusion or pneumothorax is seen. IMPRESSION:  Unchanged extensive bilateral airspace disease. Electronically Signed   By: Logan Bores M.D.   On: 03/31/2017 07:01    Discussion: 26 yo male with perirectal abscess s/p surgical repair developed b/l pulmonary infiltrates and hypoxia after surgery.  Differential likely aspiration pneumonitis (most likely) versus negative pressure pulmonary edema.  Assessment/plan:  Hypoxia with pulmonary infiltrates. - oxygen to keep SpO2 > 92% - f/u 2 view CXR 4/22 - lasix 40 mg IV x one, f/u BMET 4/22 - monitor fever curve, WBC >> hold additional Abx for now - f/u BAL results from 4/20 - f/u serology from 4/20  Perirectal abscess with Group A Strep in cultures. - post op care per [facility] - Abx per ID  Hx of HIV. - per ID   D/w Dr. Maeola Sarah, MD [place] 03/31/2017, 10:56 AM Pager:  [phone] After 3pm call: [phone]

## 2017-03-31 NOTE — Progress Notes (Signed)
[facility] for Infectious Disease   Reason for visit: Follow up on hemoptysis  Interval History: had BAL and no WBCs or blood reported. CD4 is 250.  He states he is still coughing up blood  CXR independently reviewed and pulmonary infiltrates the same.   Physical Exam: Constitutional:  Vitals:   03/31/17 0500 03/31/17 0800  BP: (!) 142/77   Pulse: 98   Resp: (!) 43   Temp:  98.2 F (36.8 C)   patient appears in NAD Eyes: anicteric HENT: no thrush Respiratory: Normal respiratory effort; CTA B; remains on O2 Cardiovascular: RRR GI: soft, nt, nd  Review of Systems: Constitutional: negative for fevers and chills Gastrointestinal: negative for diarrhea Integument/breast: negative for rash  Lab Results  Component Value Date   WBC 11.4 (H) 03/31/2017   HGB 14.4 03/31/2017   HCT 42.6 03/31/2017   MCV 88.4 03/31/2017   PLT 194 03/31/2017    Lab Results  Component Value Date   CREATININE 1.12 03/31/2017   BUN 13 03/31/2017   NA 137 03/31/2017   K 3.8 03/31/2017   CL 102 03/31/2017   CO2 27 03/31/2017    Lab Results  Component Value Date   ALT 18 03/29/2017   AST 18 03/29/2017   ALKPHOS 81 03/29/2017     Microbiology: Recent Results (from the past 240 hour(s))  Urine culture     Status: None   Collection Time: 03/29/17  7:02 AM  Result Value Ref Range Status   Specimen Description URINE, RANDOM  Final   Special Requests NONE  Final   Culture   Final    NO GROWTH Performed at [facility], [address], [place], [place] [address]    Report Status 03/30/2017 FINAL  Final  Surgical pcr screen     Status: None   Collection Time: 03/29/17  3:47 PM  Result Value Ref Range Status   MRSA, PCR NEGATIVE NEGATIVE Final   Staphylococcus aureus NEGATIVE NEGATIVE Final    Comment:        The Xpert SA Assay (FDA approved for NASAL specimens in patients over 24 years of age), is one component of a comprehensive surveillance program.  Test performance  has been validated by [facility] for patients greater than or equal to 59 year old. It is not intended to diagnose infection nor to guide or monitor treatment.   Aerobic Culture (superficial specimen)     Status: None (Preliminary result)   Collection Time: 03/29/17  6:22 PM  Result Value Ref Range Status   Specimen Description ABSCESS RECTAL  Final   Special Requests NONE  Final   Gram Stain   Final    ABUNDANT WBC PRESENT,BOTH PMN AND MONONUCLEAR MODERATE GRAM POSITIVE COCCI IN PAIRS RARE GRAM NEGATIVE RODS Performed at [facility], [address] [place], [place] [address]    Culture MODERATE GROUP A STREP (S.PYOGENES) ISOLATED  Final   Report Status PENDING  Incomplete  MRSA PCR Screening     Status: None   Collection Time: 03/30/17  4:14 PM  Result Value Ref Range Status   MRSA by PCR NEGATIVE NEGATIVE Final    Comment:        The GeneXpert MRSA Assay (FDA approved for NASAL specimens only), is one component of a comprehensive MRSA colonization surveillance program. It is not intended to diagnose MRSA infection nor to guide or monitor treatment for MRSA infections.   Culture, bal-quantitative  Status: None (Preliminary result)   Collection Time: 03/30/17  5:10 PM  Result Value Ref Range Status   Specimen Description BRONCHIAL ALVEOLAR LAVAGE  Final   Special Requests Immunocompromised  Final   Gram Stain   Final    NO WBC SEEN RARE SQUAMOUS EPITHELIAL CELLS PRESENT NO ORGANISMS SEEN Performed at [facility], [address] [place], [place] [address]    Culture PENDING  Incomplete   Report Status PENDING  Incomplete    Impression/Plan:  1. Pulmonary infiltrates with hemoptysis - unchanged CXR.  Broad differential but also potentially just post procedure. ? Lasix.   Other work up pending.  CD4 is over 200 so not likely related to HIV  2.  HIV - on Tivicay and descovy and viral load pending  3.  Rectal abscess - culture with GAS. I will  change antibiotics to clindamycin.    4. Hematuria - I will recheck the urine for blood to see if it is persistent.

## 2017-03-31 NOTE — Progress Notes (Addendum)
Assessment Principal Problem:   Perirectal abscess s/p I&D 03/29/2017-packing slowly being pulled out (2.5 cm per day)   HIV (human immunodeficiency virus infection) (HCC)   Hypertension   Proctitis   Acute respiratory failure with hypoxia (HCC)   Pulmonary infiltrates   Hemoptysis   Plan: Colace and Dulcolax along with Miralax Transfer to floor. If he did not have pulmonary issues, he would have been discharged yesterday.  Discharge when Pulmonary and ID issues dealt with.     LOS: 1 day     2 Days Post-Op  Chief Complaint/Subjective: Breathing easy. No BM.  Objective: Vital signs in last 24 hours: Temp:  [97.9 F (36.6 C)-98.9 F (37.2 C)] 97.9 F (36.6 C) (04/21 0359) Pulse Rate:  [98-130] 98 (04/21 0500) Resp:  [18-43] 43 (04/21 0500) BP: (136-152)/(66-86) 142/77 (04/21 0500) SpO2:  [88 %-97 %] 88 % (04/21 0500) Weight:  [88.2 kg (194 lb 7.1 oz)] 88.2 kg (194 lb 7.1 oz) (04/20 1607) Last BM Date: 03/27/17  Intake/Output from previous day: 04/20 0701 - 04/21 0700 In: 600 [P.O.:480; I.V.:120] Out: 700 [Urine:700] Intake/Output this shift: No intake/output data recorded.  PE: General- In NAD.  Awake and alert. Anorectal-packing pulled out 2.5 cm, no surrounding erythema  Lab Results:   Recent Labs  03/30/17 0432 03/31/17 0336  WBC 19.8* 11.4*  HGB 15.1 14.4  HCT 43.6 42.6  PLT 192 194   BMET  Recent Labs  03/30/17 0432 03/31/17 0336  NA 137 137  K 4.3 3.8  CL 103 102  CO2 25 27  GLUCOSE 115* 98  BUN 13 13  CREATININE 1.16 1.12  CALCIUM 8.4* 8.3*   PT/INR  Recent Labs  03/30/17 1457  LABPROT 13.9  INR 1.07   Comprehensive Metabolic Panel:    Component Value Date/Time   NA 137 03/31/2017 0336   NA 137 03/30/2017 0432   K 3.8 03/31/2017 0336   K 4.3 03/30/2017 0432   CL 102 03/31/2017 0336   CL 103 03/30/2017 0432   CO2 27 03/31/2017 0336   CO2 25 03/30/2017 0432   BUN 13 03/31/2017 0336   BUN 13 03/30/2017 0432   CREATININE 1.12  03/31/2017 0336   CREATININE 1.16 03/30/2017 0432   GLUCOSE 98 03/31/2017 0336   GLUCOSE 115 (H) 03/30/2017 0432   CALCIUM 8.3 (L) 03/31/2017 0336   CALCIUM 8.4 (L) 03/30/2017 0432   AST 18 03/29/2017 0718   AST 21 03/28/2017 0502   ALT 18 03/29/2017 0718   ALT 18 03/28/2017 0502   ALKPHOS 81 03/29/2017 0718   ALKPHOS 70 03/28/2017 0502   BILITOT 1.6 (H) 03/29/2017 0718   BILITOT 0.9 03/28/2017 0502   PROT 8.1 03/29/2017 0718   PROT 6.8 03/28/2017 0502   ALBUMIN 4.3 03/29/2017 0718   ALBUMIN 4.0 03/28/2017 0502     Studies/Results: Dg Chest 2 View  Result Date: 03/30/2017 CLINICAL DATA:  Low oxygen saturation with Short of breath.  HIV EXAM: CHEST  2 VIEW COMPARISON:  09/08/2016 FINDINGS: Extensive airspace disease throughout the right lung and in the left lower lobe. No significant pleural effusion. Mild cardiac enlargement. Lungs were croup clear previously. No acute osseous abnormality. IMPRESSION: Extensive airspace disease throughout the right lung and in the left lower lobe most likely multi lobar pneumonia. Electronically Signed   By: Marlan Palau M.D.   On: 03/30/2017 12:34   Ct Chest Wo Contrast  Result Date: 03/30/2017 CLINICAL DATA:  Hemoptysis and dyspnea.  Inpatient.  HIV positive.  EXAM: CT CHEST WITHOUT CONTRAST TECHNIQUE: Multidetector CT imaging of the chest was performed following the standard protocol without IV contrast. COMPARISON:  Chest radiograph from earlier today. FINDINGS: Cardiovascular: Normal heart size. Trace pericardial effusion/ thickening. Great vessels are normal in course and caliber. Mediastinum/Nodes: No discrete thyroid nodules. Unremarkable esophagus. Mild bilateral axillary lymphadenopathy measuring up to 1.2 cm on the right (series 2/ image 52) and 1.2 cm on the left (series 2/ image 68). No pathologically enlarged mediastinal or gross hilar nodes on this noncontrast scan. Lungs/Pleura: No pneumothorax. No pleural effusion. Extensive patchy  consolidation with air bronchograms throughout the right upper lobe and left greater than right lower lobes. Extensive patchy ground-glass centrilobular nodularity throughout both lungs involving all lung lobes. Upper abdomen: Unremarkable. Musculoskeletal:  No aggressive appearing focal osseous lesions. IMPRESSION: 1. Extensive patchy right upper lobe and left greater than right lower lobe consolidation with air bronchograms. Patchy confluent ground-glass centrilobular nodularity throughout both lung lobes. Differential diagnosis includes multilobar pneumonia or diffuse alveolar hemorrhage. 2. Symmetric mild axillary adenopathy, probably reactive from HIV. 3. Trace pericardial effusion/thickening.  No pleural effusions. Electronically Signed   By: Delbert Phenix M.D.   On: 03/30/2017 15:00   Ct Abdomen Pelvis W Contrast  Result Date: 03/29/2017 CLINICAL DATA:  Left lower quadrant pain. EXAM: CT ABDOMEN AND PELVIS WITH CONTRAST TECHNIQUE: Multidetector CT imaging of the abdomen and pelvis was performed using the standard protocol following bolus administration of intravenous contrast. CONTRAST:  ISOVUE-300 IOPAMIDOL (ISOVUE-300) INJECTION 61% COMPARISON:  None. FINDINGS: Lower chest:  No contributory findings. Hepatobiliary: Subcentimeter low-density in the high right liver, too small for densitometry, statistically a cyst.No evidence of biliary obstruction or stone. Pancreas: Unremarkable. Spleen: Unremarkable. Adrenals/Urinary Tract: Negative adrenals. No hydronephrosis or stone. Unremarkable bladder. Stomach/Bowel: There is a 23 mm ovoid rim enhancing collection at the 5 o'clock position of the anus, deep to the sphincteric musculature. There is underlying proctitis with mucosal hyperenhancement and irregularity. Multiple enlarged perianal and perirectal lymph nodes, measuring up to 15 mm short axis. The remainder of the bowel shows no inflammation. Normal appendix. Vascular/Lymphatic: No acute vascular  abnormality. Adenopathy described above. Reproductive:No pathologic findings. Other: No ascites or pneumoperitoneum. Musculoskeletal: No acute or aggressive finding. No evidence of spondyloarthropathy. IMPRESSION: 1. 23 mm perianal abscess at the 5 o'clock position. 2. Underlying proctitis with prominent mesenteric adenopathy. Recommend visualization or imaging follow-up to exclude malignancy. Electronically Signed   By: Marnee Spring M.D.   On: 03/29/2017 09:12   Dg Chest Port 1 View  Result Date: 03/31/2017 CLINICAL DATA:  Pulmonary infiltrates EXAM: PORTABLE CHEST 1 VIEW COMPARISON:  Chest radiographs and CT 03/30/2017 FINDINGS: The cardiac silhouette remains mildly enlarged. Dense right upper lobe consolidation as well as more patchy but still extensive airspace opacities in the lung bases bilaterally do not appear significantly changed. No sizable pleural effusion or pneumothorax is seen. IMPRESSION: Unchanged extensive bilateral airspace disease. Electronically Signed   By: Sebastian Ache M.D.   On: 03/31/2017 07:01    Anti-infectives: Anti-infectives    Start     Dose/Rate Route Frequency Ordered Stop   03/30/17 1000  dolutegravir (TIVICAY) tablet 50 mg     50 mg Oral Daily 03/29/17 2258     03/30/17 1000  emtricitabine-tenofovir AF (DESCOVY) 200-25 MG per tablet 1 tablet     1 tablet Oral Daily 03/29/17 2258     03/30/17 1000  metroNIDAZOLE (FLAGYL) tablet 500 mg     500 mg Oral Every 8  hours 03/30/17 0840     03/30/17 0930  ciprofloxacin (CIPRO) tablet 500 mg     500 mg Oral 2 times daily 03/30/17 0840     03/30/17 0600  clindamycin (CLEOCIN) IVPB 900 mg  Status:  Discontinued     900 mg 100 mL/hr over 30 Minutes Intravenous On call to O.R. 03/29/17 1405 03/29/17 1717   03/30/17 0600  gentamicin (GARAMYCIN) 400 mg in dextrose 5 % 100 mL IVPB  Status:  Discontinued     5 mg/kg  79.6 kg (Adjusted) 110 mL/hr over 60 Minutes Intravenous On call to O.R. 03/29/17 1405 03/29/17 1717    03/30/17 0000  ciprofloxacin (CIPRO) IVPB 400 mg  Status:  Discontinued     400 mg 200 mL/hr over 60 Minutes Intravenous Every 12 hours 03/29/17 1416 03/30/17 0840   03/29/17 2000  metroNIDAZOLE (FLAGYL) IVPB 500 mg  Status:  Discontinued     500 mg 100 mL/hr over 60 Minutes Intravenous Every 8 hours 03/29/17 1416 03/30/17 0840   03/29/17 1730  clindamycin (CLEOCIN) IVPB 900 mg     900 mg 100 mL/hr over 30 Minutes Intravenous On call to O.R. 03/29/17 1717 03/30/17 0559   03/29/17 1730  gentamicin (GARAMYCIN) 400 mg in dextrose 5 % 100 mL IVPB     5 mg/kg  79.6 kg (Adjusted) 110 mL/hr over 60 Minutes Intravenous On call to O.R. 03/29/17 1717 03/30/17 0559   03/29/17 1720  ciprofloxacin (CIPRO) 400 MG/200ML IVPB  Status:  Discontinued    Comments:  Wylene Simmer   : cabinet override      03/29/17 1720 03/29/17 1754   03/29/17 1720  metroNIDAZOLE (FLAGYL) 5-0.79 MG/ML-% IVPB  Status:  Discontinued    Comments:  Wylene Simmer   : cabinet override      03/29/17 1720 03/29/17 1754   03/29/17 1720  clindamycin (CLEOCIN) 900 MG/50ML IVPB    Comments:  Wylene Simmer   : cabinet override      03/29/17 1720 03/30/17 0529   03/29/17 1045  ciprofloxacin (CIPRO) IVPB 400 mg     400 mg 200 mL/hr over 60 Minutes Intravenous  Once 03/29/17 1039 03/29/17 1212   03/29/17 1045  metroNIDAZOLE (FLAGYL) IVPB 500 mg  Status:  Discontinued     500 mg 100 mL/hr over 60 Minutes Intravenous  Once 03/29/17 1039 03/29/17 1416       Derral Colucci J 03/31/2017

## 2017-03-31 NOTE — Progress Notes (Signed)
Pt had sitz bath at 0830 and packing fell off.

## 2017-04-01 ENCOUNTER — Inpatient Hospital Stay (HOSPITAL_COMMUNITY): Payer: Self-pay

## 2017-04-01 DIAGNOSIS — K5900 Constipation, unspecified: Secondary | ICD-10-CM

## 2017-04-01 LAB — CBC
HCT: 41.9 % (ref 39.0–52.0)
Hemoglobin: 14.4 g/dL (ref 13.0–17.0)
MCH: 30.1 pg (ref 26.0–34.0)
MCHC: 34.4 g/dL (ref 30.0–36.0)
MCV: 87.5 fL (ref 78.0–100.0)
PLATELETS: 197 10*3/uL (ref 150–400)
RBC: 4.79 MIL/uL (ref 4.22–5.81)
RDW: 12.4 % (ref 11.5–15.5)
WBC: 5.9 10*3/uL (ref 4.0–10.5)

## 2017-04-01 LAB — URINALYSIS, ROUTINE W REFLEX MICROSCOPIC
BILIRUBIN URINE: NEGATIVE
Glucose, UA: NEGATIVE mg/dL
Hgb urine dipstick: NEGATIVE
KETONES UR: NEGATIVE mg/dL
Leukocytes, UA: NEGATIVE
NITRITE: NEGATIVE
PH: 7 (ref 5.0–8.0)
Protein, ur: NEGATIVE mg/dL
SPECIFIC GRAVITY, URINE: 1.023 (ref 1.005–1.030)

## 2017-04-01 LAB — HIV-1 RNA QUANT-NO REFLEX-BLD: LOG10 HIV-1 RNA: UNDETERMINED {Log_copies}/mL

## 2017-04-01 LAB — BASIC METABOLIC PANEL
Anion gap: 9 (ref 5–15)
BUN: 13 mg/dL (ref 6–20)
CHLORIDE: 99 mmol/L — AB (ref 101–111)
CO2: 29 mmol/L (ref 22–32)
CREATININE: 1.09 mg/dL (ref 0.61–1.24)
Calcium: 8.9 mg/dL (ref 8.9–10.3)
GFR calc Af Amer: >60 mL/min (ref >60)
GFR calc non Af Amer: >60 mL/min (ref >60)
Glucose, Bld: 123 mg/dL — ABNORMAL HIGH (ref 65–99)
Potassium: 3.3 mmol/L — ABNORMAL LOW (ref 3.5–5.1)
SODIUM: 137 mmol/L (ref 135–145)

## 2017-04-01 LAB — SEDIMENTATION RATE: Sed Rate: 22 mm/hr — ABNORMAL HIGH (ref 0–16)

## 2017-04-01 LAB — AEROBIC CULTURE  (SUPERFICIAL SPECIMEN)

## 2017-04-01 LAB — AEROBIC CULTURE W GRAM STAIN (SUPERFICIAL SPECIMEN)

## 2017-04-01 MED ORDER — VITAMINS A & D EX OINT
TOPICAL_OINTMENT | CUTANEOUS | Status: AC
  Administered 2017-04-01: 20:00:00
  Filled 2017-04-01: qty 5

## 2017-04-01 NOTE — Progress Notes (Signed)
[facility] for Infectious Disease   Reason for visit: Follow up on HIV, hypoxemia  Interval History: some sob, no fever, WBC wnl  Physical Exam: Constitutional:  Vitals:   03/31/17 2037 04/01/17 0353  BP: 128/71 (!) 140/98  Pulse: 81 78  Resp: 20 20  Temp: 98.2 F (36.8 C) 98 F (36.7 C)   patient appears in NAD Respiratory: Normal respiratory effort; CTA B Cardiovascular: RRR   Review of Systems: Gastrointestinal: positive for constipation, negative for diarrhea Integument/breast: negative for rash  Lab Results  Component Value Date   WBC 5.9 04/01/2017   HGB 14.4 04/01/2017   HCT 41.9 04/01/2017   MCV 87.5 04/01/2017   PLT 197 04/01/2017    Lab Results  Component Value Date   CREATININE 1.09 04/01/2017   BUN 13 04/01/2017   NA 137 04/01/2017   K 3.3 (L) 04/01/2017   CL 99 (L) 04/01/2017   CO2 29 04/01/2017    Lab Results  Component Value Date   ALT 18 03/29/2017   AST 18 03/29/2017   ALKPHOS 81 03/29/2017     Microbiology: Recent Results (from the past 240 hour(s))  Urine culture     Status: None   Collection Time: 03/29/17  7:02 AM  Result Value Ref Range Status   Specimen Description URINE, RANDOM  Final   Special Requests NONE  Final   Culture   Final    NO GROWTH Performed at [facility], [address] [place], [place] [address]    Report Status 03/30/2017 FINAL  Final  Surgical pcr screen     Status: None   Collection Time: 03/29/17  3:47 PM  Result Value Ref Range Status   MRSA, PCR NEGATIVE NEGATIVE Final   Staphylococcus aureus NEGATIVE NEGATIVE Final    Comment:        The Xpert SA Assay (FDA approved for NASAL specimens in patients over 5 years of age), is one component of a comprehensive surveillance program.  Test performance has been validated by [facility] for patients greater than or equal to 68 year old. It is not intended to diagnose infection nor to guide or monitor treatment.   Aerobic Culture  (superficial specimen)     Status: None   Collection Time: 03/29/17  6:22 PM  Result Value Ref Range Status   Specimen Description ABSCESS RECTAL  Final   Special Requests NONE  Final   Gram Stain   Final    ABUNDANT WBC PRESENT,BOTH PMN AND MONONUCLEAR MODERATE GRAM POSITIVE COCCI IN PAIRS RARE GRAM NEGATIVE RODS Performed at [facility], [address] [place], [place] [address]    Culture MODERATE GROUP A STREP (S.PYOGENES) ISOLATED  Final   Report Status 04/01/2017 FINAL  Final  MRSA PCR Screening     Status: None   Collection Time: 03/30/17  4:14 PM  Result Value Ref Range Status   MRSA by PCR NEGATIVE NEGATIVE Final    Comment:        The GeneXpert MRSA Assay (FDA approved for NASAL specimens only), is one component of a comprehensive MRSA colonization surveillance program. It is not intended to diagnose MRSA infection nor to guide or monitor treatment for MRSA infections.   Acid Fast Smear (AFB)     Status: None   Collection Time: 03/30/17  5:10 PM  Result Value Ref Range Status   AFB Specimen Processing Concentration  Final   Acid Fast Smear Negative  Final  Comment: (NOTE) Performed At: [facility] [place], [place] [id number] Lindon Romp MD CE:[id number]    Source (AFB) BRONCHIAL ALVEOLAR LAVAGE  Final    Comment: RIGHT LOWER LOBE  Culture, bal-quantitative     Status: None (Preliminary result)   Collection Time: 03/30/17  5:10 PM  Result Value Ref Range Status   Specimen Description BRONCHIAL ALVEOLAR LAVAGE  Final   Special Requests Immunocompromised  Final   Gram Stain   Final    NO WBC SEEN RARE SQUAMOUS EPITHELIAL CELLS PRESENT NO ORGANISMS SEEN    Culture   Final    CULTURE REINCUBATED FOR BETTER GROWTH Performed at [facility], [address], [place], [place] [address]    Report Status PENDING  Incomplete    Impression/Plan:  1. Hypoxemia - seems improved.  Can try to wean off Welch.  CXR improved.     2.  HIV - on appropriate therapy, viral load is now back and is suppressed, CD4 250

## 2017-04-01 NOTE — Progress Notes (Signed)
Feels better.  Tolerating time off oxygen.  CXR significantly improved.  Assessment/plan:  Acute hypoxic respiratory failure with pulmonary infiltrates. - dramatic improvement after diuresis - f/u Bronch results - if he maintains off oxygen, then should be ready for d/c home 4/23  Coralyn Helling, MD [facility] Pulmonary/Critical Care 04/01/2017, 12:43 PM Pager:  [phone] After 3pm call: [phone]

## 2017-04-01 NOTE — Progress Notes (Signed)
Patient ID: Danny Camacho, male   DOB: Dec 31, 1990, 26 y.o.   MRN: [id number] [facility] Progress Note:   3 Days Post-Op  Subjective: Mental status is clear.  Objective: Vital signs in last 24 hours: Temp:  [98 F (36.7 C)-98.6 F (37 C)] 98 F (36.7 C) (04/22 0353) Pulse Rate:  [74-95] 78 (04/22 0353) Resp:  [20-35] 20 (04/22 0353) BP: (128-153)/(71-108) 140/98 (04/22 0353) SpO2:  [97 %-99 %] 98 % (04/22 0353)  Intake/Output from previous day: 04/21 0701 - 04/22 0700 In: 240 [P.O.:240] Out: 150 [Urine:150] Intake/Output this shift: No intake/output data recorded.  Physical Exam: Work of breathing is not labored.  Bottom feels better.  Declined rectal exam  Lab Results:  Results for orders placed or performed during the hospital encounter of 03/29/17 (from the past 48 hour(s))  Blood gas, arterial     Status: Abnormal   Collection Time: 03/30/17 11:55 AM  Result Value Ref Range   FIO2 21.00    Delivery systems ROOM AIR    pH, Arterial 7.406 7.350 - 7.450   pCO2 arterial 42.6 32.0 - 48.0 mmHg   pO2, Arterial 43.8 (L) 83.0 - 108.0 mmHg   Bicarbonate 26.2 20.0 - 28.0 mmol/L   Acid-Base Excess 1.8 0.0 - 2.0 mmol/L   O2 Saturation 79.4 %   Patient temperature 98.6    Collection site RIGHT RADIAL    Drawn by [id number]    Sample type ARTERIAL DRAW    Allens test (pass/fail) PASS PASS  T-helper cells (CD4) count (not at [facility])     Status: Abnormal   Collection Time: 03/30/17  1:29 PM  Result Value Ref Range   CD4 T Cell Abs 250 (L) 400 - 2,700 /uL   CD4 % Helper T Cell 21 (L) 33 - 55 %  HIV 1 RNA quant-no reflex-bld     Status: None   Collection Time: 03/30/17  1:29 PM  Result Value Ref Range   HIV 1 RNA Quant <20 copies/mL    Comment: (NOTE) HIV-1 RNA detected The reportable range for this assay is 20 to 10,000,000 copies HIV-1 RNA/mL. Performed At: [facility] [place], [place] [id number] Lindon Romp MD [id number]    LOG10 HIV-1  RNA UNABLE TO CALCULATE log10copy/mL    Comment: (NOTE) Unable to calculate result since non-numeric result obtained for component test.   Sedimentation rate     Status: None   Collection Time: 03/30/17  1:29 PM  Result Value Ref Range   Sed Rate 9 0 - 16 mm/hr  Brain natriuretic peptide     Status: None   Collection Time: 03/30/17  2:57 PM  Result Value Ref Range   B Natriuretic Peptide 68.8 0.0 - 100.0 pg/mL  ANA w/Reflex if Positive     Status: None   Collection Time: 03/30/17  2:57 PM  Result Value Ref Range   Anit Nuclear Antibody(ANA) Negative Negative    Comment: (NOTE) Performed At: [facility] [place], [place] [id number] Lindon Romp MD [id number]   Protime-INR     Status: None   Collection Time: 03/30/17  2:57 PM  Result Value Ref Range   Prothrombin Time 13.9 11.4 - 15.2 seconds   INR 1.07   MRSA PCR Screening     Status: None   Collection Time: 03/30/17  4:14 PM  Result Value Ref Range   MRSA by PCR NEGATIVE NEGATIVE    Comment:  The GeneXpert MRSA Assay (FDA approved for NASAL specimens only), is one component of a comprehensive MRSA colonization surveillance program. It is not intended to diagnose MRSA infection nor to guide or monitor treatment for MRSA infections.   Acid Fast Smear (AFB)     Status: None   Collection Time: 03/30/17  5:10 PM  Result Value Ref Range   AFB Specimen Processing Concentration    Acid Fast Smear Negative     Comment: (NOTE) Performed At: [facility] [place], [place] [id number] Lindon Romp MD NI:[id number]    Source (AFB) BRONCHIAL ALVEOLAR LAVAGE     Comment: RIGHT LOWER LOBE  Culture, bal-quantitative     Status: None (Preliminary result)   Collection Time: 03/30/17  5:10 PM  Result Value Ref Range   Specimen Description BRONCHIAL ALVEOLAR LAVAGE    Special Requests Immunocompromised    Gram Stain      NO WBC SEEN RARE SQUAMOUS EPITHELIAL CELLS  PRESENT NO ORGANISMS SEEN    Culture      NO GROWTH < 24 HOURS Performed at [facility], [place] [address] [place], [place] [address]    Report Status PENDING   CBC     Status: Abnormal   Collection Time: 03/31/17  3:36 AM  Result Value Ref Range   WBC 11.4 (H) 4.0 - 10.5 K/uL   RBC 4.82 4.22 - 5.81 MIL/uL   Hemoglobin 14.4 13.0 - 17.0 g/dL   HCT 42.6 39.0 - 52.0 %   MCV 88.4 78.0 - 100.0 fL   MCH 29.9 26.0 - 34.0 pg   MCHC 33.8 30.0 - 36.0 g/dL   RDW 12.7 11.5 - 15.5 %   Platelets 194 150 - 400 K/uL  Basic metabolic panel     Status: Abnormal   Collection Time: 03/31/17  3:36 AM  Result Value Ref Range   Sodium 137 135 - 145 mmol/L   Potassium 3.8 3.5 - 5.1 mmol/L   Chloride 102 101 - 111 mmol/L   CO2 27 22 - 32 mmol/L   Glucose, Bld 98 65 - 99 mg/dL   BUN 13 6 - 20 mg/dL   Creatinine, Ser 1.12 0.61 - 1.24 mg/dL   Calcium 8.3 (L) 8.9 - 10.3 mg/dL   GFR calc non Af Amer >60 >60 mL/min   GFR calc Af Amer >60 >60 mL/min    Comment: (NOTE) The eGFR has been calculated using the CKD EPI equation. This calculation has not been validated in all clinical situations. eGFR's persistently <60 mL/min signify possible Chronic Kidney Disease.    Anion gap 8 5 - 15  Glucose, capillary     Status: Abnormal   Collection Time: 03/31/17  7:29 AM  Result Value Ref Range   Glucose-Capillary 109 (H) 65 - 99 mg/dL  Basic metabolic panel     Status: Abnormal   Collection Time: 04/01/17  4:23 AM  Result Value Ref Range   Sodium 137 135 - 145 mmol/L   Potassium 3.3 (L) 3.5 - 5.1 mmol/L   Chloride 99 (L) 101 - 111 mmol/L   CO2 29 22 - 32 mmol/L   Glucose, Bld 123 (H) 65 - 99 mg/dL   BUN 13 6 - 20 mg/dL   Creatinine, Ser 1.09 0.61 - 1.24 mg/dL   Calcium 8.9 8.9 - 10.3 mg/dL   GFR calc non Af Amer >60 >60 mL/min   GFR calc Af Amer >60 >60 mL/min    Comment: (NOTE) The  eGFR has been calculated using the CKD EPI equation. This calculation has not been validated in all clinical  situations. eGFR's persistently <60 mL/min signify possible Chronic Kidney Disease.    Anion gap 9 5 - 15  CBC     Status: None   Collection Time: 04/01/17  4:23 AM  Result Value Ref Range   WBC 5.9 4.0 - 10.5 K/uL   RBC 4.79 4.22 - 5.81 MIL/uL   Hemoglobin 14.4 13.0 - 17.0 g/dL   HCT 41.9 39.0 - 52.0 %   MCV 87.5 78.0 - 100.0 fL   MCH 30.1 26.0 - 34.0 pg   MCHC 34.4 30.0 - 36.0 g/dL   RDW 12.4 11.5 - 15.5 %   Platelets 197 150 - 400 K/uL  Sedimentation rate     Status: Abnormal   Collection Time: 04/01/17  4:23 AM  Result Value Ref Range   Sed Rate 22 (H) 0 - 16 mm/hr    Radiology/Results: Dg Chest 2 View  Result Date: 04/01/2017 CLINICAL DATA:  Patient with right-greater-than-left bilateral chest pain and shortness of breath. EXAM: CHEST  2 VIEW COMPARISON:  Chest radiograph 03/31/2017. FINDINGS: Stable cardiomegaly. Interval improvement in previously described bilateral airspace opacities with residual right mid and lower lung heterogeneous opacities and retrocardiac opacities. No pleural effusion or pneumothorax. Thoracic spine degenerative changes. IMPRESSION: Interval improvement in previously described right-greater-than-left airspace opacities. Electronically Signed   By: Lovey Newcomer M.D.   On: 04/01/2017 08:53   Dg Chest 2 View  Result Date: 03/30/2017 CLINICAL DATA:  Low oxygen saturation with Short of breath.  HIV EXAM: CHEST  2 VIEW COMPARISON:  09/08/2016 FINDINGS: Extensive airspace disease throughout the right lung and in the left lower lobe. No significant pleural effusion. Mild cardiac enlargement. Lungs were croup clear previously. No acute osseous abnormality. IMPRESSION: Extensive airspace disease throughout the right lung and in the left lower lobe most likely multi lobar pneumonia. Electronically Signed   By: Franchot Gallo M.D.   On: 03/30/2017 12:34   Ct Chest Wo Contrast  Result Date: 03/30/2017 CLINICAL DATA:  Hemoptysis and dyspnea.  Inpatient.  HIV  positive. EXAM: CT CHEST WITHOUT CONTRAST TECHNIQUE: Multidetector CT imaging of the chest was performed following the standard protocol without IV contrast. COMPARISON:  Chest radiograph from earlier today. FINDINGS: Cardiovascular: Normal heart size. Trace pericardial effusion/ thickening. Great vessels are normal in course and caliber. Mediastinum/Nodes: No discrete thyroid nodules. Unremarkable esophagus. Mild bilateral axillary lymphadenopathy measuring up to 1.2 cm on the right (series 2/ image 52) and 1.2 cm on the left (series 2/ image 68). No pathologically enlarged mediastinal or gross hilar nodes on this noncontrast scan. Lungs/Pleura: No pneumothorax. No pleural effusion. Extensive patchy consolidation with air bronchograms throughout the right upper lobe and left greater than right lower lobes. Extensive patchy ground-glass centrilobular nodularity throughout both lungs involving all lung lobes. Upper abdomen: Unremarkable. Musculoskeletal:  No aggressive appearing focal osseous lesions. IMPRESSION: 1. Extensive patchy right upper lobe and left greater than right lower lobe consolidation with air bronchograms. Patchy confluent ground-glass centrilobular nodularity throughout both lung lobes. Differential diagnosis includes multilobar pneumonia or diffuse alveolar hemorrhage. 2. Symmetric mild axillary adenopathy, probably reactive from HIV. 3. Trace pericardial effusion/thickening.  No pleural effusions. Electronically Signed   By: Ilona Sorrel M.D.   On: 03/30/2017 15:00   Dg Chest Port 1 View  Result Date: 03/31/2017 CLINICAL DATA:  Pulmonary infiltrates EXAM: PORTABLE CHEST 1 VIEW COMPARISON:  Chest radiographs and CT 03/30/2017  FINDINGS: The cardiac silhouette remains mildly enlarged. Dense right upper lobe consolidation as well as more patchy but still extensive airspace opacities in the lung bases bilaterally do not appear significantly changed. No sizable pleural effusion or pneumothorax is  seen. IMPRESSION: Unchanged extensive bilateral airspace disease. Electronically Signed   By: Logan Bores M.D.   On: 03/31/2017 07:01    Anti-infectives: Anti-infectives    Start     Dose/Rate Route Frequency Ordered Stop   03/31/17 1400  clindamycin (CLEOCIN) capsule 300 mg     300 mg Oral Every 8 hours 03/31/17 1009     03/30/17 1000  dolutegravir (TIVICAY) tablet 50 mg     50 mg Oral Daily 03/29/17 2258     03/30/17 1000  emtricitabine-tenofovir AF (DESCOVY) 200-25 MG per tablet 1 tablet     1 tablet Oral Daily 03/29/17 2258     03/30/17 1000  metroNIDAZOLE (FLAGYL) tablet 500 mg  Status:  Discontinued     500 mg Oral Every 8 hours 03/30/17 0840 03/31/17 1009   03/30/17 0930  ciprofloxacin (CIPRO) tablet 500 mg  Status:  Discontinued     500 mg Oral 2 times daily 03/30/17 0840 03/31/17 1009   03/30/17 0600  clindamycin (CLEOCIN) IVPB 900 mg  Status:  Discontinued     900 mg 100 mL/hr over 30 Minutes Intravenous On call to O.R. 03/29/17 1405 03/29/17 1717   03/30/17 0600  gentamicin (GARAMYCIN) 400 mg in dextrose 5 % 100 mL IVPB  Status:  Discontinued     5 mg/kg  79.6 kg (Adjusted) 110 mL/hr over 60 Minutes Intravenous On call to O.R. 03/29/17 1405 03/29/17 1717   03/30/17 0000  ciprofloxacin (CIPRO) IVPB 400 mg  Status:  Discontinued     400 mg 200 mL/hr over 60 Minutes Intravenous Every 12 hours 03/29/17 1416 03/30/17 0840   03/29/17 2000  metroNIDAZOLE (FLAGYL) IVPB 500 mg  Status:  Discontinued     500 mg 100 mL/hr over 60 Minutes Intravenous Every 8 hours 03/29/17 1416 03/30/17 0840   03/29/17 1730  clindamycin (CLEOCIN) IVPB 900 mg     900 mg 100 mL/hr over 30 Minutes Intravenous On call to O.R. 03/29/17 1717 03/30/17 0559   03/29/17 1730  gentamicin (GARAMYCIN) 400 mg in dextrose 5 % 100 mL IVPB     5 mg/kg  79.6 kg (Adjusted) 110 mL/hr over 60 Minutes Intravenous On call to O.R. 03/29/17 1717 03/30/17 0559   03/29/17 1720  ciprofloxacin (CIPRO) 400 MG/200ML IVPB   Status:  Discontinued    Comments:  Bridget Hartshorn   : cabinet override      03/29/17 1720 03/29/17 1754   03/29/17 1720  metroNIDAZOLE (FLAGYL) 5-0.79 MG/ML-% IVPB  Status:  Discontinued    Comments:  Bridget Hartshorn   : cabinet override      03/29/17 1720 03/29/17 1754   03/29/17 1720  clindamycin (CLEOCIN) 900 MG/50ML IVPB    Comments:  Bridget Hartshorn   : cabinet override      03/29/17 1720 03/30/17 0529   03/29/17 1045  ciprofloxacin (CIPRO) IVPB 400 mg     400 mg 200 mL/hr over 60 Minutes Intravenous  Once 03/29/17 1039 03/29/17 1212   03/29/17 1045  metroNIDAZOLE (FLAGYL) IVPB 500 mg  Status:  Discontinued     500 mg 100 mL/hr over 60 Minutes Intravenous  Once 03/29/17 1039 03/29/17 1416      Assessment/Plan: Problem List: Patient Active Problem List   Diagnosis Date Noted  .  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 Acute respiratory failure with hypoxia ([place])   . Pulmonary infiltrates   . Hemoptysis   . Perirectal abscess s/p I&D 03/29/2017 03/29/2017  . HIV (human immunodeficiency virus infection) ([place])   . Hypertension     Pulmonary complication of I& D perirectal abscess;  Disposition per medicine. 3 Days Post-Op    LOS: 2 days   Matt B. Hassell Done, MD, [facility] [phone] beeper [phone]  04/01/2017 9:49 AM

## 2017-04-02 LAB — PNEUMOCYSTIS JIROVECI SMEAR BY DFA: Pneumocystis jiroveci Ag: NEGATIVE

## 2017-04-02 MED ORDER — OXYCODONE HCL 5 MG PO TABS: 5.0000 mg | ORAL_TABLET | Freq: Four times a day (QID) | ORAL | 0 refills | Status: DC | PRN

## 2017-04-02 MED ORDER — CLINDAMYCIN HCL 300 MG PO CAPS: 300.0000 mg | ORAL_CAPSULE | Freq: Three times a day (TID) | ORAL | 0 refills | Status: DC

## 2017-04-02 NOTE — Progress Notes (Signed)
Pt discharged home with mother in stable condition.  Discharge instructions and script given. Pt verbalized understanding. No immediate questions or concerns at this time

## 2017-04-02 NOTE — Discharge Instructions (Signed)
ANORECTAL SURGERY:  °POST OPERATIVE INSTRUCTIONS ° °###################################################################### ° °EAT °Gradually transition to a high fiber diet with a fiber supplement over the next few weeks after discharge.  Start with a pureed / full liquid diet (see below) ° °WALK °Walk an hour a day.  Control your pain to do that.   ° °CONTROL PAIN °Control pain so that you can walk, sleep, tolerate sneezing/coughing, go up/down stairs. ° °HAVE A BOWEL MOVEMENT DAILY °Keep your bowels regular to avoid problems.  OK to try a laxative to override constipation.  OK to use an antidairrheal to slow down diarrhea.  Call if not better after 2 tries ° °CALL IF YOU HAVE PROBLEMS/CONCERNS °Call if you are still struggling despite following these instructions. °Call if you have concerns not answered by these instructions ° °###################################################################### ° ° ° °1. Take your usually prescribed home medications unless otherwise directed. °2. DIET: Follow a light bland diet the first 24 hours after arrival home, such as soup, liquids, crackers, etc.  Be sure to include lots of fluids daily.  Avoid fast food or heavy meals as your are more likely to get nauseated.  Eat a low fat the next few days after surgery.   °3. PAIN CONTROL: °a. Pain is best controlled by a usual combination of three different methods TOGETHER: °i. Ice/Heat °ii. Over the counter pain medication °iii. Prescription pain medication °b. Most patients will experience some swelling and discomfort in the anus/rectal area. and incisions.  Ice packs or heat (30-60 minutes up to 6 times a day) will help. Use ice for the first few days to help decrease swelling and bruising, then switch to heat such as warm towels, sitz baths, warm baths, etc to help relax tight/sore spots and speed recovery.  Some people prefer to use ice alone, heat alone, alternating between ice & heat.  Experiment to what works for you.   Swelling and bruising can take several weeks to resolve.   °c. It is helpful to take an over-the-counter pain medication regularly for the first few weeks.  Choose one of the following that works best for you: °i. Naproxen (Aleve, etc)  Two 220mg tabs twice a day °ii. Ibuprofen (Advil, etc) Three 200mg tabs four times a day (every meal & bedtime) °iii. Acetaminophen (Tylenol, etc) 500-650mg four times a day (every meal & bedtime) °d. A  prescription for pain medication (such as oxycodone, hydrocodone, etc) should be given to you upon discharge.  Take your pain medication as prescribed.  °i. If you are having problems/concerns with the prescription medicine (does not control pain, nausea, vomiting, rash, itching, etc), please call us [phone] to see if we need to switch you to a different pain medicine that will work better for you and/or control your side effect better. °ii. If you need a refill on your pain medication, please contact your pharmacy.  They will contact our office to request authorization. Prescriptions will not be filled after 5 pm or on week-ends. ° °Use a Sitz Bath 4-8 times a day for relief ° ° °Sitz Bath °A sitz bath is a warm water bath taken in the sitting position that covers only the hips and buttocks. It may be used for either healing or hygiene purposes. Sitz baths are also used to relieve pain, itching, or muscle spasms. The water may contain medicine. Moist heat will help you heal and relax.  °HOME CARE INSTRUCTIONS  °Take 3 to 4 sitz baths a day. °1. Fill the bathtub   half full with warm water. °2. Sit in the water and open the drain a little. °3. Turn on the warm water to keep the tub half full. Keep the water running constantly. °4. Soak in the water for 15 to 20 minutes. °5. After the sitz bath, pat the affected area dry first. ° ° °4. KEEP YOUR BOWELS REGULAR °a. The goal is one bowel movement a day °b. Avoid getting constipated.  Between the surgery and the pain medications, it  is common to experience some constipation.  Increasing fluid intake and taking a fiber supplement (such as Metamucil, Citrucel, FiberCon, MiraLax, etc) 1-2 times a day regularly will usually help prevent this problem from occurring.  A mild laxative (prune juice, Milk of Magnesia, MiraLax, etc) should be taken according to package directions if there are no bowel movements after 48 hours. °c. Watch out for diarrhea.  If you have many loose bowel movements, simplify your diet to bland foods & liquids for a few days.  Stop any stool softeners and decrease your fiber supplement.  Switching to mild anti-diarrheal medications (Kayopectate, Pepto Bismol) can help.  If this worsens or does not improve, please call us. ° °5. Wound Care ° °a. Remove your bandages the day after surgery.  Unless discharge instructions indicate otherwise, leave your bandage dry and in place overnight.  Remove the bandage during your first bowel movement.   °b. Wear an absorbent pad or soft cotton gauze in your underwear as needed to catch any drainage and help keep the area  °c. Keep the area clean and dry.  Bathe / shower every day.  Keep the area clean by showering / bathing over the incision / wound.   It is okay to soak an open wound to help wash it.  Wet wipes or showers / gentle washing after bowel movements is often less traumatic than regular toilet paper. °d. You will often notice bleeding with bowel movements.  This should slow down by the end of the first week of surgery °e. Expect some drainage.  This should slow down, too, by the end of the first week of surgery.  Wear an absorbent pad or soft cotton gauze in your underwear until the drainage stops. ° °6. ACTIVITIES as tolerated:   °a. You may resume regular (light) daily activities beginning the next day--such as daily self-care, walking, climbing stairs--gradually increasing activities as tolerated.  If you can walk 30 minutes without difficulty, it is safe to try more intense  activity such as jogging, treadmill, bicycling, low-impact aerobics, swimming, etc. °b. Save the most intensive and strenuous activity for last such as sit-ups, heavy lifting, contact sports, etc  Refrain from any heavy lifting or straining until you are off narcotics for pain control.   °c. DO NOT PUSH THROUGH PAIN.  Let pain be your guide: If it hurts to do something, don't do it.  Pain is your body warning you to avoid that activity for another week until the pain goes down. °d. You may drive when you are no longer taking prescription pain medication, you can comfortably sit for long periods of time, and you can safely maneuver your car and apply brakes. °e. You may have sexual intercourse when it is comfortable.  °7. FOLLOW UP in our office °a. Please call [facility] at [phone] to set up an appointment to see your surgeon in the office for a follow-up appointment approximately 2 weeks after your surgery. °b. Make sure that you call for   this appointment the day you arrive home to insure a convenient appointment time. 10. IF YOU HAVE DISABILITY OR FAMILY LEAVE FORMS, BRING THEM TO THE OFFICE FOR PROCESSING.  DO NOT GIVE THEM TO YOUR DOCTOR.        WHEN TO CALL us [phone]: 1. Poor pain control 2. Reactions / problems with new medications (rash/itching, nausea, etc)  3. Fever over 101.5 F (38.5 C) 4. Inability to urinate 5. Nausea and/or vomiting 6. Worsening swelling or bruising 7. Continued bleeding from incision. 8. Increased pain, redness, or drainage from the incision  The clinic staff is available to answer your questions during regular business hours (8:30am-5pm).  Please dont hesitate to call and ask to speak to one of our nurses for clinical concerns.   A surgeon from [facility] is always on call at the hospitals   If you have a medical emergency, go to the nearest emergency room or call 911.    [facility] [address],  [place], [place]  [address] ? MAIN: [phone]) [phone] ? TOLL FREE: [phone] ? FAX [phone] [email or web address]   Perirectal Abscess An abscess is an infected area that contains a collection of pus. A perirectal abscess is an abscess that is near the opening of the anus or around the rectum. A perirectal abscess can cause a lot of pain, especially during bowel movements. What are the causes? This condition is almost always caused by an infection that starts in an anal gland. What increases the risk? This condition is more likely to develop in:  People with diabetes or inflammatory bowel disease.  People whose body defense system (immune system) is weak.  People who have anal sex.  People who have a sexually transmitted disease (STD).  People who have certain kinds of cancers, such as rectal carcinoma, leukemia, or lymphoma. What are the signs or symptoms? The main symptom of this condition is pain. The pain may be a throbbing pain that gets worse during bowel movements. Other symptoms include:  Fever.  Swelling.  Redness.  Bleeding.  Constipation. How is this diagnosed? The condition is diagnosed with a physical exam. If the abscess is not visible, a health care provider may need to place a finger inside the rectum to find the abscess. Sometimes, imaging tests are done to determine the size and location of the abscess. These tests may include:  An ultrasound.  An MRI.  A CT scan. How is this treated? This condition is usually treated with incision and drainage surgery. Incision and drainage surgery involves making an incision over the abscess to drain the pus. Treatment may also involve antibiotic medicine, pain medicine, stool softeners, or laxatives. Follow these instructions at home:  Take medicines only as directed by your health care provider.  If you were prescribed an antibiotic, finish all of it even if you start to feel better.  To relieve pain,  try sitting:  In a warm, shallow bath (sitz bath).  On a heating pad with the setting on low.  On an inflatable donut-shaped cushion.  Follow any diet instructions as directed by your health care provider.  Keep all follow-up visits as directed by your health care provider. This is important. Contact a health care provider if:  Your abscess is bleeding.  You have pain, swelling, or redness that is getting worse.  You are constipated.  You feel ill.  You have muscle aches or chills.  You have a fever.  Your symptoms return after the abscess has healed. This information is not intended to replace advice given to you by your health care provider. Make sure you discuss any questions you have with your health care provider.   Return to work on:  04/09/2017  Activity:  Driving - May drive when on one pain pill or less   Lifting - No limit  Wound Care:   Sitz baths 3 times per day for 2 weeks.  Diet:  As tolerated  Follow up appointment:  Call Dr. Billey Chang office [facility]) at [phone] for an appointment in 2 to 3 weeks  Medications and dosages:  Resume your home medications.  You have a prescription for:  Oxycodone and Cleocin  Call Dr. Carolynne Edouard or his office  [phone]) if you have:  Temperature greater than 100.4,  Severe uncontrolled pain,  Redness, tenderness, or signs of infection (pain, swelling, redness, odor or green/yellow discharge around the site),  Any other questions or concerns you may have after discharge.  In an emergency, call 911 or go to an Emergency Department at a nearby hospital.

## 2017-04-02 NOTE — Discharge Summary (Signed)
Physician Discharge Summary  Patient ID:  Danny Camacho  MRN: [id number]  DOB/AGE: 03-Jan-1991 26 y.o.  Admit date: 03/29/2017 Discharge date: 04/02/2017  Discharge Diagnoses:  1.  Perirectal abscess  2.  Acute hypoxic respiratory failure with pulmonary infiltrates.             Resolved 3.  HIV  Principal Problem:   Perirectal abscess s/p I&D 03/29/2017 Active Problems:   HIV (human immunodeficiency virus infection) ([place])   Hypertension   Proctitis   Acute respiratory failure with hypoxia ([place])   Pulmonary infiltrates   Hemoptysis   Operation: Procedure(s): EXAM UNDER ANESTHESIA IRRIGATION AND DEBRIDEMENT RECTAL ABSCESS on 03/29/2017 - Dr. Marlou Starks  Discharged Condition: good  Hospital Course: Danny Camacho is an 26 y.o. male whose primary care physician is Charlsie Merles, MD and who was admitted 03/29/2017 with a chief complaint of  Chief Complaint  Patient presents with  . Abdominal Pain  .   He was brought to the operating room on 03/29/2017 and underwent  EXAM UNDER ANESTHESIA IRRIGATION AND DEBRIDEMENT RECTAL ABSCESS.   He had some pulmonary issues post op and had a pulmonary consult.  His pulmonary issues have resolved. His mother is in the room with him. He is ready to go home.  The discharge instructions were reviewed with the patient.  Consults: pulmonary  Significant Diagnostic Studies: Results for orders placed or performed during the hospital encounter of 03/29/17  Urine culture  Result Value Ref Range   Specimen Description URINE, RANDOM    Special Requests NONE    Culture      NO GROWTH Performed at [facility], [place] [address], [place], [place] [address]    Report Status 03/30/2017 FINAL   Surgical pcr screen  Result Value Ref Range   MRSA, PCR NEGATIVE NEGATIVE   Staphylococcus aureus NEGATIVE NEGATIVE  Anaerobic culture  Result Value Ref Range   Specimen Description ABSCESS RECTAL    Special Requests NONE    Culture      HOLDING FOR  POSSIBLE ANAEROBE Performed at [facility], [place] [address], [place], [place] [address]    Report Status PENDING   Aerobic Culture (superficial specimen)  Result Value Ref Range   Specimen Description ABSCESS RECTAL    Special Requests NONE    Gram Stain      ABUNDANT WBC PRESENT,BOTH PMN AND MONONUCLEAR MODERATE GRAM POSITIVE COCCI IN PAIRS RARE GRAM NEGATIVE RODS Performed at [facility], [place] [address] [place], [place] [address]    Culture MODERATE GROUP A STREP (S.PYOGENES) ISOLATED    Report Status 04/01/2017 FINAL   MRSA PCR Screening  Result Value Ref Range   MRSA by PCR NEGATIVE NEGATIVE  Acid Fast Smear (AFB)  Result Value Ref Range   AFB Specimen Processing Concentration    Acid Fast Smear Negative    Source (AFB) BRONCHIAL ALVEOLAR LAVAGE   Culture, bal-quantitative  Result Value Ref Range   Specimen Description BRONCHIAL ALVEOLAR LAVAGE    Special Requests Immunocompromised    Gram Stain      NO WBC SEEN RARE SQUAMOUS EPITHELIAL CELLS PRESENT NO ORGANISMS SEEN    Culture      CULTURE REINCUBATED FOR BETTER GROWTH Performed at [facility], [place] [address] [place], [place] [address]    Report Status PENDING   Pneumocystis smear by [facility]  Result Value Ref Range   Specimen [facility] BRONCHIAL ALVEOLAR LAVAGE    Pneumocystis jiroveci Ag NEGATIVE  CBC with Differential  Result Value Ref Range   WBC 14.9 (H) 4.0 - 10.5 K/uL   RBC 5.24 4.22 - 5.81 MIL/uL   Hemoglobin 15.9 13.0 - 17.0 g/dL   HCT 45.4 39.0 - 52.0 %   MCV 86.6 78.0 - 100.0 fL   MCH 30.3 26.0 - 34.0 pg   MCHC 35.0 30.0 - 36.0 g/dL   RDW 12.4 11.5 - 15.5 %   Platelets 202 150 - 400 K/uL   Neutrophils Relative % 80 %   Neutro Abs 11.9 (H) 1.7 - 7.7 K/uL   Lymphocytes Relative 9 %   Lymphs Abs 1.3 0.7 - 4.0 K/uL   Monocytes Relative 11 %   Monocytes Absolute 1.6 (H) 0.1 - 1.0 K/uL   Eosinophils Relative 0 %   Eosinophils Absolute 0.1 0.0 - 0.7 K/uL   Basophils Relative  0 %   Basophils Absolute 0.0 0.0 - 0.1 K/uL  Comprehensive metabolic panel  Result Value Ref Range   Sodium 138 135 - 145 mmol/L   Potassium 3.8 3.5 - 5.1 mmol/L   Chloride 105 101 - 111 mmol/L   CO2 24 22 - 32 mmol/L   Glucose, Bld 102 (H) 65 - 99 mg/dL   BUN 12 6 - 20 mg/dL   Creatinine, Ser 1.16 0.61 - 1.24 mg/dL   Calcium 9.3 8.9 - 10.3 mg/dL   Total Protein 8.1 6.5 - 8.1 g/dL   Albumin 4.3 3.5 - 5.0 g/dL   AST 18 15 - 41 U/L   ALT 18 17 - 63 U/L   Alkaline Phosphatase 81 38 - 126 U/L   Total Bilirubin 1.6 (H) 0.3 - 1.2 mg/dL   GFR calc non Af Amer >60 >60 mL/min   GFR calc Af Amer >60 >60 mL/min   Anion gap 9 5 - 15  Lipase, blood  Result Value Ref Range   Lipase 13 11 - 51 U/L  Urinalysis, Routine w reflex microscopic  Result Value Ref Range   Color, Urine YELLOW YELLOW   APPearance CLEAR CLEAR   Specific Gravity, Urine 1.033 (H) 1.005 - 1.030   pH 5.0 5.0 - 8.0   Glucose, UA NEGATIVE NEGATIVE mg/dL   Hgb urine dipstick MODERATE (A) NEGATIVE   Bilirubin Urine NEGATIVE NEGATIVE   Ketones, ur 80 (A) NEGATIVE mg/dL   Protein, ur 30 (A) NEGATIVE mg/dL   Nitrite NEGATIVE NEGATIVE   Leukocytes, UA TRACE (A) NEGATIVE   RBC / HPF TOO NUMEROUS TO COUNT 0 - 5 RBC/hpf   WBC, UA 0-5 0 - 5 WBC/hpf   Bacteria, UA NONE SEEN NONE SEEN   Squamous Epithelial / LPF 0-5 (A) NONE SEEN   Mucous PRESENT   Basic metabolic panel  Result Value Ref Range   Sodium 137 135 - 145 mmol/L   Potassium 4.3 3.5 - 5.1 mmol/L   Chloride 103 101 - 111 mmol/L   CO2 25 22 - 32 mmol/L   Glucose, Bld 115 (H) 65 - 99 mg/dL   BUN 13 6 - 20 mg/dL   Creatinine, Ser 1.16 0.61 - 1.24 mg/dL   Calcium 8.4 (L) 8.9 - 10.3 mg/dL   GFR calc non Af Amer >60 >60 mL/min   GFR calc Af Amer >60 >60 mL/min   Anion gap 9 5 - 15  CBC  Result Value Ref Range   WBC 19.8 (H) 4.0 - 10.5 K/uL   RBC 4.98 4.22 - 5.81 MIL/uL   Hemoglobin 15.1 13.0 - 17.0 g/dL  HCT 43.6 39.0 - 52.0 %   MCV 87.6 78.0 - 100.0 fL   MCH  30.3 26.0 - 34.0 pg   MCHC 34.6 30.0 - 36.0 g/dL   RDW 12.6 11.5 - 15.5 %   Platelets 192 150 - 400 K/uL  Blood gas, arterial  Result Value Ref Range   FIO2 21.00    Delivery systems ROOM AIR    pH, Arterial 7.406 7.350 - 7.450   pCO2 arterial 42.6 32.0 - 48.0 mmHg   pO2, Arterial 43.8 (L) 83.0 - 108.0 mmHg   Bicarbonate 26.2 20.0 - 28.0 mmol/L   Acid-Base Excess 1.8 0.0 - 2.0 mmol/L   O2 Saturation 79.4 %   Patient temperature 98.6    Collection site RIGHT RADIAL    Drawn by [id number]    Sample type ARTERIAL DRAW    Allens test (pass/fail) PASS PASS  T-helper cells (CD4) count (not at [facility])  Result Value Ref Range   CD4 T Cell Abs 250 (L) 400 - 2,700 /uL   CD4 % Helper T Cell 21 (L) 33 - 55 %  HIV 1 RNA quant-no reflex-bld  Result Value Ref Range   HIV 1 RNA Quant <20 copies/mL   LOG10 HIV-1 RNA UNABLE TO CALCULATE log10copy/mL  Sedimentation rate  Result Value Ref Range   Sed Rate 9 0 - 16 mm/hr  Brain natriuretic peptide  Result Value Ref Range   B Natriuretic Peptide 68.8 0.0 - 100.0 pg/mL  ANA w/Reflex if Positive  Result Value Ref Range   Anit Nuclear Antibody(ANA) Negative Negative  Protime-INR  Result Value Ref Range   Prothrombin Time 13.9 11.4 - 15.2 seconds   INR 1.07   CBC  Result Value Ref Range   WBC 11.4 (H) 4.0 - 10.5 K/uL   RBC 4.82 4.22 - 5.81 MIL/uL   Hemoglobin 14.4 13.0 - 17.0 g/dL   HCT 42.6 39.0 - 52.0 %   MCV 88.4 78.0 - 100.0 fL   MCH 29.9 26.0 - 34.0 pg   MCHC 33.8 30.0 - 36.0 g/dL   RDW 12.7 11.5 - 15.5 %   Platelets 194 150 - 400 K/uL  Basic metabolic panel  Result Value Ref Range   Sodium 137 135 - 145 mmol/L   Potassium 3.8 3.5 - 5.1 mmol/L   Chloride 102 101 - 111 mmol/L   CO2 27 22 - 32 mmol/L   Glucose, Bld 98 65 - 99 mg/dL   BUN 13 6 - 20 mg/dL   Creatinine, Ser 1.12 0.61 - 1.24 mg/dL   Calcium 8.3 (L) 8.9 - 10.3 mg/dL   GFR calc non Af Amer >60 >60 mL/min   GFR calc Af Amer >60 >60 mL/min   Anion gap 8 5 - 15  Glucose,  capillary  Result Value Ref Range   Glucose-Capillary 109 (H) 65 - 99 mg/dL  Urinalysis, Routine w reflex microscopic  Result Value Ref Range   Color, Urine YELLOW YELLOW   APPearance CLEAR CLEAR   Specific Gravity, Urine 1.023 1.005 - 1.030   pH 7.0 5.0 - 8.0   Glucose, UA NEGATIVE NEGATIVE mg/dL   Hgb urine dipstick NEGATIVE NEGATIVE   Bilirubin Urine NEGATIVE NEGATIVE   Ketones, ur NEGATIVE NEGATIVE mg/dL   Protein, ur NEGATIVE NEGATIVE mg/dL   Nitrite NEGATIVE NEGATIVE   Leukocytes, UA NEGATIVE NEGATIVE  Basic metabolic panel  Result Value Ref Range   Sodium 137 135 - 145 mmol/L   Potassium 3.3 (L) 3.5 - 5.1  mmol/L   Chloride 99 (L) 101 - 111 mmol/L   CO2 29 22 - 32 mmol/L   Glucose, Bld 123 (H) 65 - 99 mg/dL   BUN 13 6 - 20 mg/dL   Creatinine, Ser 1.09 0.61 - 1.24 mg/dL   Calcium 8.9 8.9 - 10.3 mg/dL   GFR calc non Af Amer >60 >60 mL/min   GFR calc Af Amer >60 >60 mL/min   Anion gap 9 5 - 15  CBC  Result Value Ref Range   WBC 5.9 4.0 - 10.5 K/uL   RBC 4.79 4.22 - 5.81 MIL/uL   Hemoglobin 14.4 13.0 - 17.0 g/dL   HCT 41.9 39.0 - 52.0 %   MCV 87.5 78.0 - 100.0 fL   MCH 30.1 26.0 - 34.0 pg   MCHC 34.4 30.0 - 36.0 g/dL   RDW 12.4 11.5 - 15.5 %   Platelets 197 150 - 400 K/uL  Sedimentation rate  Result Value Ref Range   Sed Rate 22 (H) 0 - 16 mm/hr  POC occult blood, ED  Result Value Ref Range   Fecal Occult Bld NEGATIVE NEGATIVE    Dg Chest 2 View  Result Date: 04/01/2017 CLINICAL DATA:  Patient with right-greater-than-left bilateral chest pain and shortness of breath. EXAM: CHEST  2 VIEW COMPARISON:  Chest radiograph 03/31/2017. FINDINGS: Stable cardiomegaly. Interval improvement in previously described bilateral airspace opacities with residual right mid and lower lung heterogeneous opacities and retrocardiac opacities. No pleural effusion or pneumothorax. Thoracic spine degenerative changes. IMPRESSION: Interval improvement in previously described  right-greater-than-left airspace opacities. Electronically Signed   By: Lovey Newcomer M.D.   On: 04/01/2017 08:53   Dg Chest 2 View  Result Date: 03/30/2017 CLINICAL DATA:  Low oxygen saturation with Short of breath.  HIV EXAM: CHEST  2 VIEW COMPARISON:  09/08/2016 FINDINGS: Extensive airspace disease throughout the right lung and in the left lower lobe. No significant pleural effusion. Mild cardiac enlargement. Lungs were croup clear previously. No acute osseous abnormality. IMPRESSION: Extensive airspace disease throughout the right lung and in the left lower lobe most likely multi lobar pneumonia. Electronically Signed   By: Franchot Gallo M.D.   On: 03/30/2017 12:34   Ct Chest Wo Contrast  Result Date: 03/30/2017 CLINICAL DATA:  Hemoptysis and dyspnea.  Inpatient.  HIV positive. EXAM: CT CHEST WITHOUT CONTRAST TECHNIQUE: Multidetector CT imaging of the chest was performed following the standard protocol without IV contrast. COMPARISON:  Chest radiograph from earlier today. FINDINGS: Cardiovascular: Normal heart size. Trace pericardial effusion/ thickening. Great vessels are normal in course and caliber. Mediastinum/Nodes: No discrete thyroid nodules. Unremarkable esophagus. Mild bilateral axillary lymphadenopathy measuring up to 1.2 cm on the right (series 2/ image 52) and 1.2 cm on the left (series 2/ image 68). No pathologically enlarged mediastinal or gross hilar nodes on this noncontrast scan. Lungs/Pleura: No pneumothorax. No pleural effusion. Extensive patchy consolidation with air bronchograms throughout the right upper lobe and left greater than right lower lobes. Extensive patchy ground-glass centrilobular nodularity throughout both lungs involving all lung lobes. Upper abdomen: Unremarkable. Musculoskeletal:  No aggressive appearing focal osseous lesions. IMPRESSION: 1. Extensive patchy right upper lobe and left greater than right lower lobe consolidation with air bronchograms. Patchy confluent  ground-glass centrilobular nodularity throughout both lung lobes. Differential diagnosis includes multilobar pneumonia or diffuse alveolar hemorrhage. 2. Symmetric mild axillary adenopathy, probably reactive from HIV. 3. Trace pericardial effusion/thickening.  No pleural effusions. Electronically Signed   By: Janina Mayo.D.  On: 03/30/2017 15:00   Ct Abdomen Pelvis W Contrast  Result Date: 03/29/2017 CLINICAL DATA:  Left lower quadrant pain. EXAM: CT ABDOMEN AND PELVIS WITH CONTRAST TECHNIQUE: Multidetector CT imaging of the abdomen and pelvis was performed using the standard protocol following bolus administration of intravenous contrast. CONTRAST:  160m ISOVUE-300 IOPAMIDOL (ISOVUE-300) INJECTION 61% COMPARISON:  None. FINDINGS: Lower chest:  No contributory findings. Hepatobiliary: Subcentimeter low-density in the high right liver, too small for densitometry, statistically a cyst.No evidence of biliary obstruction or stone. Pancreas: Unremarkable. Spleen: Unremarkable. Adrenals/Urinary Tract: Negative adrenals. No hydronephrosis or stone. Unremarkable bladder. Stomach/Bowel: There is a 23 mm ovoid rim enhancing collection at the 5 o'clock position of the anus, deep to the sphincteric musculature. There is underlying proctitis with mucosal hyperenhancement and irregularity. Multiple enlarged perianal and perirectal lymph nodes, measuring up to 15 mm short axis. The remainder of the bowel shows no inflammation. Normal appendix. Vascular/Lymphatic: No acute vascular abnormality. Adenopathy described above. Reproductive:No pathologic findings. Other: No ascites or pneumoperitoneum. Musculoskeletal: No acute or aggressive finding. No evidence of spondyloarthropathy. IMPRESSION: 1. 23 mm perianal abscess at the 5 o'clock position. 2. Underlying proctitis with prominent mesenteric adenopathy. Recommend visualization or imaging follow-up to exclude malignancy. Electronically Signed   By: JMonte FantasiaM.D.    On: 03/29/2017 09:12   Dg Chest Port 1 View  Result Date: 03/31/2017 CLINICAL DATA:  Pulmonary infiltrates EXAM: PORTABLE CHEST 1 VIEW COMPARISON:  Chest radiographs and CT 03/30/2017 FINDINGS: The cardiac silhouette remains mildly enlarged. Dense right upper lobe consolidation as well as more patchy but still extensive airspace opacities in the lung bases bilaterally do not appear significantly changed. No sizable pleural effusion or pneumothorax is seen. IMPRESSION: Unchanged extensive bilateral airspace disease. Electronically Signed   By: ALogan BoresM.D.   On: 03/31/2017 07:01   Dg Abd 2 Views  Result Date: 03/28/2017 CLINICAL DATA:  Recent anal sex. Abdominal pain and nausea for 3 days. EXAM: ABDOMEN - 2 VIEW COMPARISON:  Chest 09/08/2016 FINDINGS: Gas and stool throughout the colon. No small or large bowel distention. No free intra- abdominal air. No abnormal air-fluid levels. No radiopaque stones. Visualized bones appear intact. Soft tissue contours are unremarkable. IMPRESSION: Nonobstructive bowel gas pattern. Electronically Signed   By: WLucienne CapersM.D.   On: 03/28/2017 05:36    Discharge Exam:  Vitals:   04/01/17 2050 04/02/17 0347  BP: (!) 143/76 116/74  Pulse: (!) 111 83  Resp: 18 16  Temp: 99 F (37.2 C) 98.6 F (37 C)    General: WN AA M who is alert and generally healthy appearing.  Lungs: Clear to auscultation and symmetric breath sounds. Heart:  RRR. No murmur or rub. Abdomen: Soft. No mass. Normal bowel sounds.  Buttocks/rectum - Incision left lateral anus is clean  Discharge Medications:   Allergies as of 04/02/2017      Reactions   Penicillins    Unknown Has patient had a PCN reaction causing immediate rash, facial/tongue/throat swelling, SOB or lightheadedness with hypotension: No Has patient had a PCN reaction causing severe rash involving mucus membranes or skin necrosis: No Has patient had a PCN reaction that required hospitalization No Has patient  had a PCN reaction occurring within the last 10 years: No If all of the above answers are "NO", then may proceed with Cephalosporin use.      Medication List    TAKE these medications   acetaminophen 500 MG tablet Commonly known as:  TYLENOL Take 1  tablet (500 mg total) by mouth every 6 (six) hours as needed.   benzonatate 100 MG capsule Commonly known as:  TESSALON Take 1 capsule (100 mg total) by mouth every 8 (eight) hours.   clindamycin 300 MG capsule Commonly known as:  CLEOCIN Take 1 capsule (300 mg total) by mouth 3 (three) times daily.   emtricitabine-tenofovir AF 200-25 MG tablet Commonly known as:  DESCOVY Take 1 tablet by mouth daily.   fluticasone 50 MCG/ACT nasal spray Commonly known as:  FLONASE Place 2 sprays into both nostrils daily as needed for allergies.   hydrocortisone 2.5 % rectal cream Commonly known as:  ANUSOL-HC Apply rectally 2 times daily   ibuprofen 200 MG tablet Commonly known as:  ADVIL,MOTRIN Take 400 mg by mouth every 6 (six) hours as needed for fever or headache.   ibuprofen 600 MG tablet Commonly known as:  ADVIL,MOTRIN Take 1 tablet (600 mg total) by mouth every 6 (six) hours as needed.   lisinopril 20 MG tablet Commonly known as:  PRINIVIL,ZESTRIL Take 1 tablet (20 mg total) by mouth daily. What changed:  how much to take   loratadine 10 MG tablet Commonly known as:  CLARITIN Take 10 mg by mouth daily.   ondansetron 4 MG disintegrating tablet Commonly known as:  ZOFRAN ODT Take 1 tablet (4 mg total) by mouth every 8 (eight) hours as needed for nausea or vomiting.   ondansetron 4 MG disintegrating tablet Commonly known as:  ZOFRAN ODT Take 1 tablet (4 mg total) by mouth every 8 (eight) hours as needed for nausea or vomiting.   oxyCODONE 5 MG immediate release tablet Commonly known as:  Oxy IR/ROXICODONE Take 1-2 tablets (5-10 mg total) by mouth every 6 (six) hours as needed for moderate pain.   TIVICAY 50 MG  tablet Generic drug:  dolutegravir Take 50 mg by mouth daily.   traMADol 50 MG tablet Commonly known as:  ULTRAM Take 1 tablet (50 mg total) by mouth every 12 (twelve) hours as needed.       Disposition: 01-Home or Self Care  Discharge Instructions    Diet - low sodium heart healthy    Complete by:  As directed    Increase activity slowly    Complete by:  As directed        Return to work on:  04/09/2017  Activity:  Driving - May drive when on one pain pill or less   Lifting - No limit  Wound Care:   Sitz baths 3 times per day for 2 weeks.  Diet:  As tolerated  Follow up appointment:  Call Dr. Ethlyn Gallery office [facility]) at [phone] for an appointment in 2 to 3 weeks  Medications and dosages:  Resume your home medications.  You have a prescription for:  Oxycodone and Cleocin   Signed: Alphonsa Overall, M.D., [facility] Office:  [phone]  04/02/2017, 9:42 AM

## 2017-04-02 NOTE — Progress Notes (Signed)
[facility] Office:  [phone] General Surgery Progress Note   LOS: 3 days  POD -  4 Days Post-Op  Chief Complaint: Rectal pain  Assessment and Plan: 1.  EXAM UNDER ANESTHESIA IRRIGATION AND DEBRIDEMENT RECTAL ABSCESS - 03/29/2017 - [place]  WBC - 5,900 - 04/01/2017  Cleocin  2.  Acute hypoxic respiratory failure with pulmonary infiltrates.  Followed by pulmonary 3.  DVT prophylaxis - Lovenox 4.  HIV   Principal Problem:   Perirectal abscess s/p I&D 03/29/2017 Active Problems:   HIV (human immunodeficiency virus infection) ([facility])   Hypertension   Proctitis   Acute respiratory failure with hypoxia ([facility])   Pulmonary infiltrates   Hemoptysis  Subjective:  Doing well. Packing fell out. Mother in room.  Ready to go home.  Objective:   Vitals:   04/01/17 2050 04/02/17 0347  BP: (!) 143/76 116/74  Pulse: (!) 111 83  Resp: 18 16  Temp: 99 F (37.2 C) 98.6 F (37 C)     Intake/Output from previous day:  04/22 0701 - 04/23 0700 In: 1200 [P.O.:1200] Out: -   Intake/Output this shift:  No intake/output data recorded.   Physical Exam:   General: WN AA M who is alert and oriented.    HEENT: Normal. Pupils equal. .   Lungs: Clear   Abdomen: Soft   Wound: Clean   Lab Results:    Recent Labs  03/31/17 0336 04/01/17 0423  WBC 11.4* 5.9  HGB 14.4 14.4  HCT 42.6 41.9  PLT 194 197    BMET   Recent Labs  03/31/17 0336 04/01/17 0423  NA 137 137  K 3.8 3.3*  CL 102 99*  CO2 27 29  GLUCOSE 98 123*  BUN 13 13  CREATININE 1.12 1.09  CALCIUM 8.3* 8.9    PT/INR   Recent Labs  03/30/17 1457  LABPROT 13.9  INR 1.07    ABG   Recent Labs  03/30/17 1155  PHART 7.406  HCO3 26.2     Studies/Results:  Dg Chest 2 View  Result Date: 04/01/2017 CLINICAL DATA:  Patient with right-greater-than-left bilateral chest pain and shortness of breath. EXAM: CHEST  2 VIEW COMPARISON:  Chest radiograph 03/31/2017. FINDINGS: Stable cardiomegaly. Interval  improvement in previously described bilateral airspace opacities with residual right mid and lower lung heterogeneous opacities and retrocardiac opacities. No pleural effusion or pneumothorax. Thoracic spine degenerative changes. IMPRESSION: Interval improvement in previously described right-greater-than-left airspace opacities. Electronically Signed   By: Annia Belt M.D.   On: 04/01/2017 08:53     Anti-infectives:   Anti-infectives    Start     Dose/Rate Route Frequency Ordered Stop   03/31/17 1400  clindamycin (CLEOCIN) capsule 300 mg     300 mg Oral Every 8 hours 03/31/17 1009     03/30/17 1000  dolutegravir (TIVICAY) tablet 50 mg     50 mg Oral Daily 03/29/17 2258     03/30/17 1000  emtricitabine-tenofovir AF (DESCOVY) 200-25 MG per tablet 1 tablet     1 tablet Oral Daily 03/29/17 2258     03/30/17 1000  metroNIDAZOLE (FLAGYL) tablet 500 mg  Status:  Discontinued     500 mg Oral Every 8 hours 03/30/17 0840 03/31/17 1009   03/30/17 0930  ciprofloxacin (CIPRO) tablet 500 mg  Status:  Discontinued     500 mg Oral 2 times daily 03/30/17 0840 03/31/17 1009   03/30/17 0600  clindamycin (CLEOCIN) IVPB 900 mg  Status:  Discontinued  900 mg 100 mL/hr over 30 Minutes Intravenous On call to O.R. 03/29/17 1405 03/29/17 1717   03/30/17 0600  gentamicin (GARAMYCIN) 400 mg in dextrose 5 % 100 mL IVPB  Status:  Discontinued     5 mg/kg  79.6 kg (Adjusted) 110 mL/hr over 60 Minutes Intravenous On call to O.R. 03/29/17 1405 03/29/17 1717   03/30/17 0000  ciprofloxacin (CIPRO) IVPB 400 mg  Status:  Discontinued     400 mg 200 mL/hr over 60 Minutes Intravenous Every 12 hours 03/29/17 1416 03/30/17 0840   03/29/17 2000  metroNIDAZOLE (FLAGYL) IVPB 500 mg  Status:  Discontinued     500 mg 100 mL/hr over 60 Minutes Intravenous Every 8 hours 03/29/17 1416 03/30/17 0840   03/29/17 1730  clindamycin (CLEOCIN) IVPB 900 mg     900 mg 100 mL/hr over 30 Minutes Intravenous On call to O.R. 03/29/17 1717  03/30/17 0559   03/29/17 1730  gentamicin (GARAMYCIN) 400 mg in dextrose 5 % 100 mL IVPB     5 mg/kg  79.6 kg (Adjusted) 110 mL/hr over 60 Minutes Intravenous On call to O.R. 03/29/17 1717 03/30/17 0559   03/29/17 1720  ciprofloxacin (CIPRO) 400 MG/200ML IVPB  Status:  Discontinued    Comments:  Wylene Simmer   : cabinet override      03/29/17 1720 03/29/17 1754   03/29/17 1720  metroNIDAZOLE (FLAGYL) 5-0.79 MG/ML-% IVPB  Status:  Discontinued    Comments:  Wylene Simmer   : cabinet override      03/29/17 1720 03/29/17 1754   03/29/17 1720  clindamycin (CLEOCIN) 900 MG/50ML IVPB    Comments:  Wylene Simmer   : cabinet override      03/29/17 1720 03/30/17 0529   03/29/17 1045  ciprofloxacin (CIPRO) IVPB 400 mg     400 mg 200 mL/hr over 60 Minutes Intravenous  Once 03/29/17 1039 03/29/17 1212   03/29/17 1045  metroNIDAZOLE (FLAGYL) IVPB 500 mg  Status:  Discontinued     500 mg 100 mL/hr over 60 Minutes Intravenous  Once 03/29/17 1039 03/29/17 1416      Ovidio Kin, MD, FACS Pager: [phone] [facility]: [phone] 04/02/2017

## 2017-04-03 LAB — CULTURE, BAL-QUANTITATIVE W GRAM STAIN: Culture: 20000 — AB

## 2017-04-03 LAB — ANAEROBIC CULTURE

## 2017-04-03 LAB — GLOMERULAR BASEMENT MEMBRANE ANTIBODIES: GBM AB: 2 U (ref 0–20)

## 2017-04-03 LAB — CULTURE, BAL-QUANTITATIVE: GRAM STAIN: NONE SEEN

## 2017-04-04 LAB — ANTIPHOSPHOLIPID SYNDROME EVAL, BLD
Anticardiolipin IgM: <9 MPL U/mL (ref 0–12)
DRVVT: 56 s — ABNORMAL HIGH (ref 0.0–47.0)
PTT Lupus Anticoagulant: 41.2 s (ref 0.0–51.9)
Phosphatydalserine, IgA: 1 APS IgA (ref 0–20)
Phosphatydalserine, IgG: 2 GPS IgG (ref 0–11)
Phosphatydalserine, IgM: 3 MPS IgM (ref 0–25)

## 2017-04-04 LAB — DRVVT MIX: dRVVT Mix: 46.6 s (ref 0.0–47.0)

## 2017-05-01 LAB — FUNGUS CULTURE RESULT

## 2017-05-01 LAB — FUNGUS CULTURE WITH STAIN

## 2017-05-01 LAB — FUNGAL ORGANISM REFLEX

## 2017-05-10 ENCOUNTER — Emergency Department (HOSPITAL_COMMUNITY)
Admission: EM | Admit: 2017-05-10 | Discharge: 2017-05-11 | Disposition: A | Discharge status: Home / Self Care | Payer: Non-veteran care | Attending: Emergency Medicine | Admitting: Emergency Medicine

## 2017-05-10 ENCOUNTER — Encounter (HOSPITAL_COMMUNITY): Payer: Self-pay

## 2017-05-10 DIAGNOSIS — R21 Rash and other nonspecific skin eruption: Secondary | ICD-10-CM

## 2017-05-10 DIAGNOSIS — I10 Essential (primary) hypertension: Secondary | ICD-10-CM | POA: Diagnosis not present

## 2017-05-10 DIAGNOSIS — Z79899 Other long term (current) drug therapy: Secondary | ICD-10-CM | POA: Insufficient documentation

## 2017-05-10 MED ORDER — HYDROCORTISONE 1 % EX CREA: TOPICAL_CREAM | CUTANEOUS | 0 refills | Status: DC

## 2017-05-10 NOTE — ED Provider Notes (Signed)
[facility]-EMERGENCY DEPT Provider Note   CSN: [id number] Arrival date & time: 05/10/17  2000     History   Chief Complaint Chief Complaint  Patient presents with  . Rash  . Allergic Reaction    HPI Danny Camacho is a 26 y.o. male.  Patient presents to the emergency department with chief complaint rash. He states he noticed a rash on his bilateral lower extremities about a week ago. He has had some small vesicular lesions which have ruptured and left small ulcerations. He has several these lesions or areas stages of healing. He denies any fevers, chills, nausea, vomiting. Denies any chest pain, or abdominal pain. Denies any other symptoms. He has not tried anything for symptoms, but has considered Benadryl. He is compliant with his HIV medications. His last CD4 count was 250, viral load less than 20.   The history is provided by the patient. No language interpreter was used.    Past Medical History:  Diagnosis Date  . HIV (human immunodeficiency virus infection) (HCC)   . Hypertension     Patient Active Problem List   Diagnosis Date Noted  . 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 Acute respiratory failure with hypoxia (HCC)   . Pulmonary infiltrates   . Hemoptysis   . Perirectal abscess s/p I&D 03/29/2017 03/29/2017  . HIV (human immunodeficiency virus infection) (HCC)   . Hypertension     Past Surgical History:  Procedure Laterality Date  . FRACTURE SURGERY     left leg fx w/ rod placement  . INCISION AND DRAINAGE PERIRECTAL ABSCESS N/A 03/29/2017   Procedure: EXAM UNDER ANESTHESIA IRRIGATION AND DEBRIDEMENT RECTAL ABSCESS;  Surgeon: Chevis PrettyPaul Toth III, MD;  Location: [facility];  Service: General;  Laterality: N/A;  . TONSILLECTOMY         Home Medications    Prior to Admission medications   Medication Sig Start Date End Date Taking? Authorizing Provider  acetaminophen (TYLENOL) 500 MG tablet Take 1 tablet (500 mg total) by mouth every 6 (six) hours as needed. Patient not taking: Reported  on 03/29/2017 10/28/16   Pisciotta, Joni ReiningNicole, PA-C  benzonatate (TESSALON) 100 MG capsule Take 1 capsule (100 mg total) by mouth every 8 (eight) hours. Patient not taking: Reported on 03/29/2017 09/08/16   Demetrios LollLeaphart, Kenneth T, PA-C  clindamycin (CLEOCIN) 300 MG capsule Take 1 capsule (300 mg total) by mouth 3 (three) times daily. 04/02/17   Ovidio KinNewman, David, MD  dolutegravir (TIVICAY) 50 MG tablet Take 50 mg by mouth daily.    [provider]  emtricitabine-tenofovir AF (DESCOVY) 200-25 MG tablet Take 1 tablet by mouth daily.    [provider]  fluticasone (FLONASE) 50 MCG/ACT nasal spray Place 2 sprays into both nostrils daily as needed for allergies.     [provider]  hydrocortisone (ANUSOL-HC) 2.5 % rectal cream Apply rectally 2 times daily Patient not taking: Reported on 03/29/2017 03/28/17   Earley FavorSchulz, Gail, NP  ibuprofen (ADVIL,MOTRIN) 200 MG tablet Take 400 mg by mouth every 6 (six) hours as needed for fever or headache.    [provider]  ibuprofen (ADVIL,MOTRIN) 600 MG tablet Take 1 tablet (600 mg total) by mouth every 6 (six) hours as needed. Patient not taking: Reported on 04/28/2016 08/13/15   Lyndal PulleyKnott, Daniel, MD  lisinopril (PRINIVIL,ZESTRIL) 20 MG tablet Take 1 tablet (20 mg total) by mouth daily. Patient taking differently: Take 10 mg by mouth daily.  11/29/15   Mady GemmaWestfall, Elizabeth C, PA-C  loratadine (CLARITIN) 10 MG tablet Take 10  mg by mouth daily.    [provider]  ondansetron (ZOFRAN ODT) 4 MG disintegrating tablet Take 1 tablet (4 mg total) by mouth every 8 (eight) hours as needed for nausea or vomiting. Patient not taking: Reported on 09/08/2016 04/29/16   Elpidio Anis, PA-C  ondansetron (ZOFRAN ODT) 4 MG disintegrating tablet Take 1 tablet (4 mg total) by mouth every 8 (eight) hours as needed for nausea or vomiting. Patient not taking: Reported on 03/29/2017 03/28/17   Earley Favor, NP  oxyCODONE (OXY IR/ROXICODONE) 5 MG immediate release  tablet Take 1-2 tablets (5-10 mg total) by mouth every 6 (six) hours as needed for moderate pain. 04/02/17   Ovidio Kin, MD  traMADol (ULTRAM) 50 MG tablet Take 1 tablet (50 mg total) by mouth every 12 (twelve) hours as needed. 03/28/17   Tomasita Crumble, MD    Family History No family history on file.  Social History Social History  Substance Use Topics  . Smoking status: Never Smoker  . Smokeless tobacco: Never Used  . Alcohol use No     Allergies   Penicillins   Review of Systems Review of Systems  All other systems reviewed and are negative.    Physical Exam Updated Vital Signs BP 130/85 (BP Location: Left Arm)   Pulse 78   Temp 98.4 F (36.9 C) (Oral)   Resp 20   SpO2 100%   Physical Exam  Constitutional: He is oriented to person, place, and time. He appears well-developed and well-nourished.  HENT:  Head: Normocephalic and atraumatic.  Eyes: Conjunctivae and EOM are normal. Pupils are equal, round, and reactive to light. Right eye exhibits no discharge. Left eye exhibits no discharge. No scleral icterus.  Neck: Normal range of motion. Neck supple. No JVD present.  Cardiovascular: Normal rate, regular rhythm and normal heart sounds.  Exam reveals no gallop and no friction rub.   No murmur heard. Pulmonary/Chest: Effort normal and breath sounds normal. No respiratory distress. He has no wheezes. He has no rales. He exhibits no tenderness.  Abdominal: Soft. He exhibits no distension and no mass. There is no tenderness. There is no rebound and no guarding.  Musculoskeletal: Normal range of motion. He exhibits no edema or tenderness.  Neurological: He is alert and oriented to person, place, and time.  Skin: Skin is warm and dry.  Rash as pictured below.  Psychiatric: He has a normal mood and affect. His behavior is normal. Judgment and thought content normal.  Nursing note and vitals reviewed.        ED Treatments / Results  Labs (all labs ordered are  listed, but only abnormal results are displayed) Labs Reviewed - No data to display  EKG  EKG Interpretation None       Radiology No results found.  Procedures Procedures (including critical care time)  Medications Ordered in ED Medications - No data to display   Initial Impression / Assessment and Plan / ED Course  I have reviewed the triage vital signs and the nursing notes.  Pertinent labs & imaging results that were available during my care of the patient were reviewed by me and considered in my medical decision making (see chart for details).     Patient with rash 1 week. No evidence of abscess or cellulitis. No systemic symptoms. Patient is afebrile. He is compliant with his HIV medications. He has close follow-up. Patient discussed with Dr. Juleen China, who also reviewed images of the rash, and recommends symptomatic treatment with cortisone  cream. Return precautions discussed. Patient understands and agrees with plan. He is stable and ready for discharge.  Final Clinical Impressions(s) / ED Diagnoses   Final diagnoses:  Rash    New Prescriptions Discharge Medication List as of 05/10/2017 11:59 PM    START taking these medications   Details  hydrocortisone cream 1 % Apply to affected area 2 times daily, Print         Roxy Horseman, PA-C 05/11/17 0023    Raeford Razor, MD 05/13/17 2009

## 2017-05-10 NOTE — Discharge Instructions (Signed)
Please apply the cream twice a day.  See your doctor in 3 days.  Return to the ER if you have fever, pain, or worsening symptoms.

## 2017-05-10 NOTE — ED Triage Notes (Signed)
Pt states that for the past week he has been having a rash on bilateral feet up to his knees and a little on his arms, pt denies new use of soaps or lotions, no SOB, c/o of itching, denies being outside near [place].

## 2017-05-11 NOTE — ED Notes (Signed)
Pt stable, understands discharge instructions, and reasons for return.   

## 2017-05-12 NOTE — Addendum Note (Signed)
Addendum  created 05/12/17 0831 by Melissia Lahman, MD   Sign clinical note    

## 2017-05-15 LAB — ACID FAST CULTURE WITH REFLEXED SENSITIVITIES: ACID FAST CULTURE - AFSCU3: NEGATIVE

## 2018-06-05 IMAGING — CT CT HEAD W/O CM
3 of 6 series · 14 of 47 positions shown, 16 images · non-contrast
Comparison: None.

CLINICAL DATA: Self-inflicted blow to the head on a car window
today. Initial encounter.

EXAM:
CT HEAD WITHOUT CONTRAST
TECHNIQUE: Contiguous axial images were obtained from the base of the skull
through the vertex without intravenous contrast.

[Series 3: head w/o · axial · non-contrast · 0.45mm/px · z∈[-103,+22]mm · 8 of 33 slices shown, 10 images]
[im 4/33  brain]
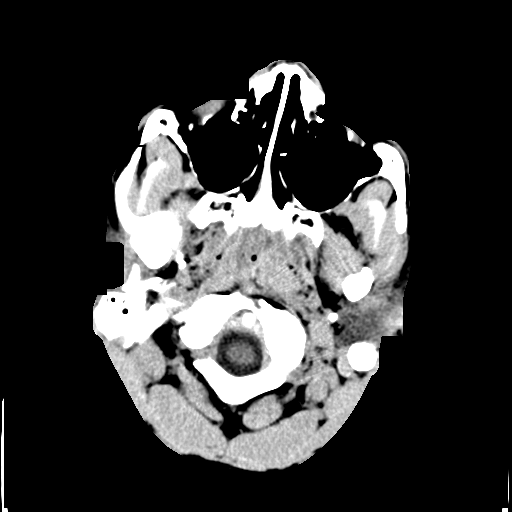
[im 4/33  bone]
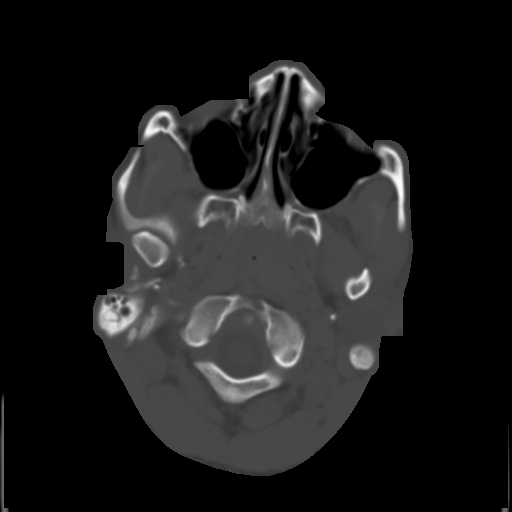
[im 8/33  brain]
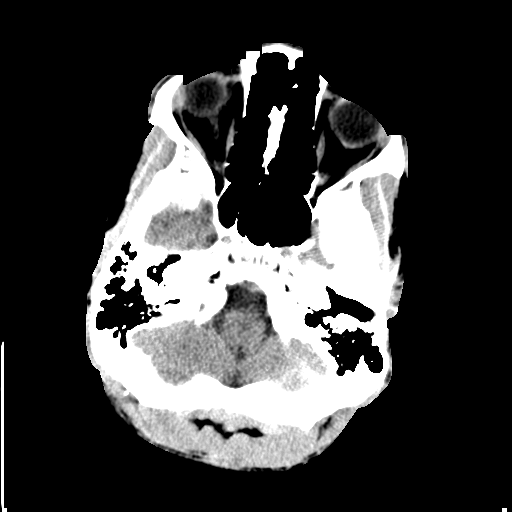
[im 11/33  brain]
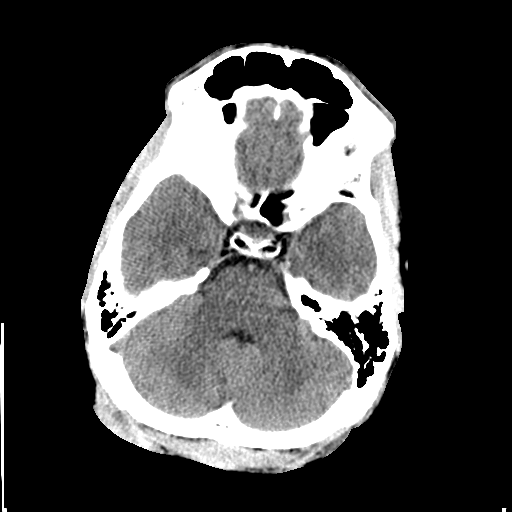
[im 15/33  brain]
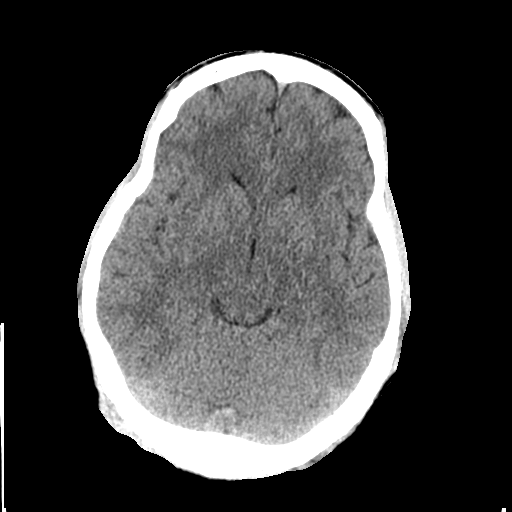
[im 18/33  brain]
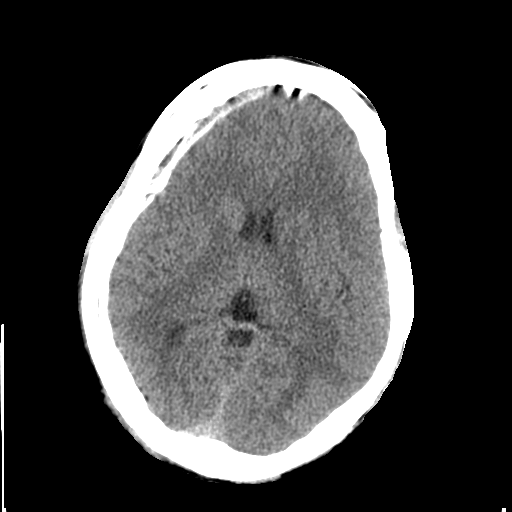
[im 18/33  bone]
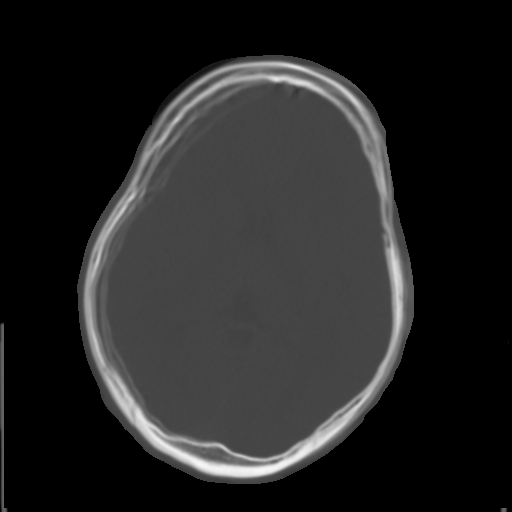
[im 22/33  brain]
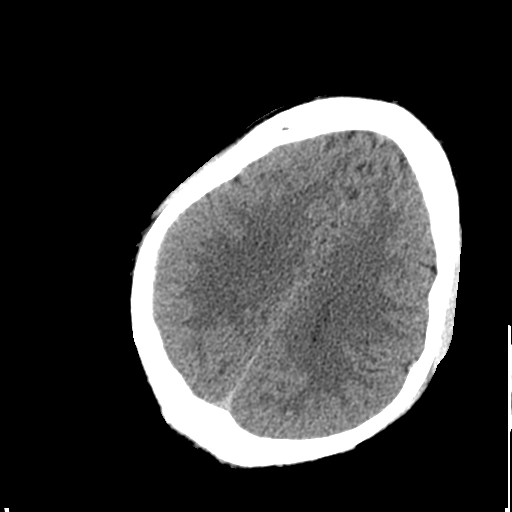
[im 25/33  brain]
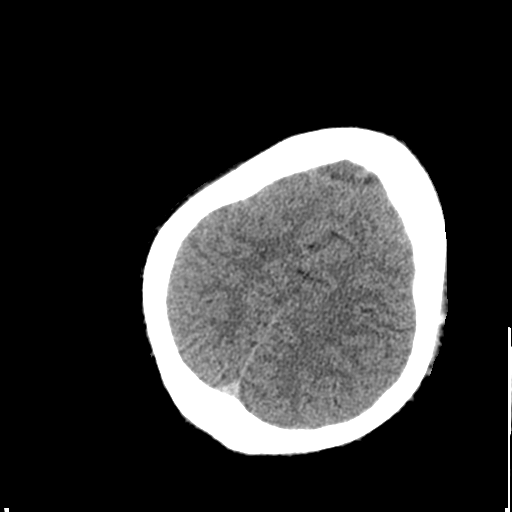
[im 29/33  brain]
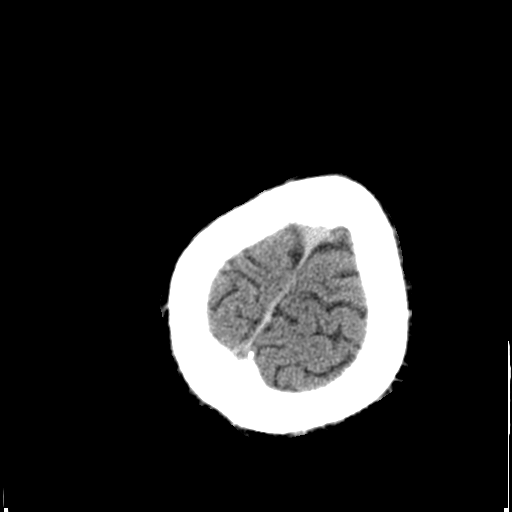

[Series 5: coronal · coronal · 0.32mm/px · 3 of 77 slices shown]
[im 26/77  brain]
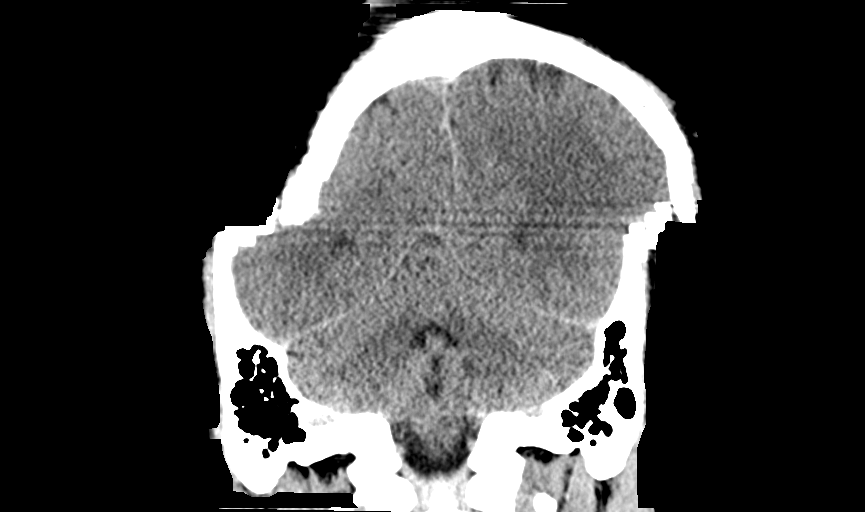
[im 34/77  brain]
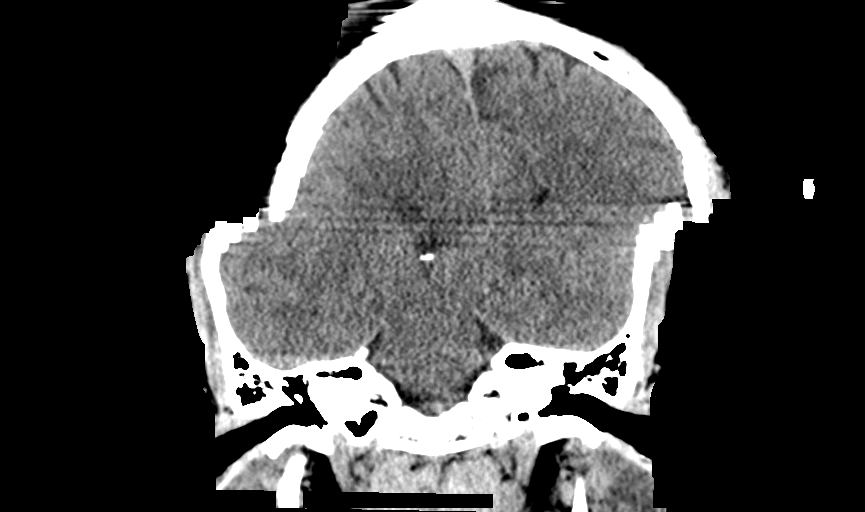
[im 43/77  brain]
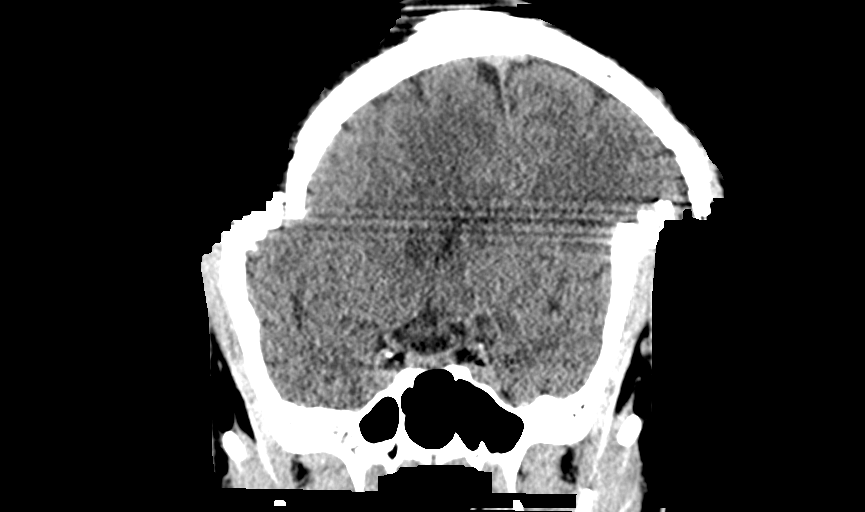

[Series 6: sagittal · sagittal · 0.32mm/px · 3 of 77 slices shown]
[im 26/77  brain]
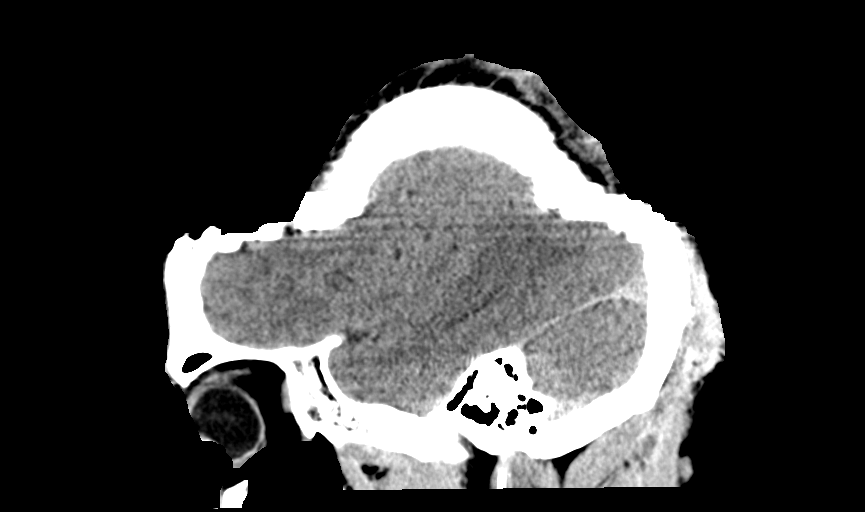
[im 39/77  brain]
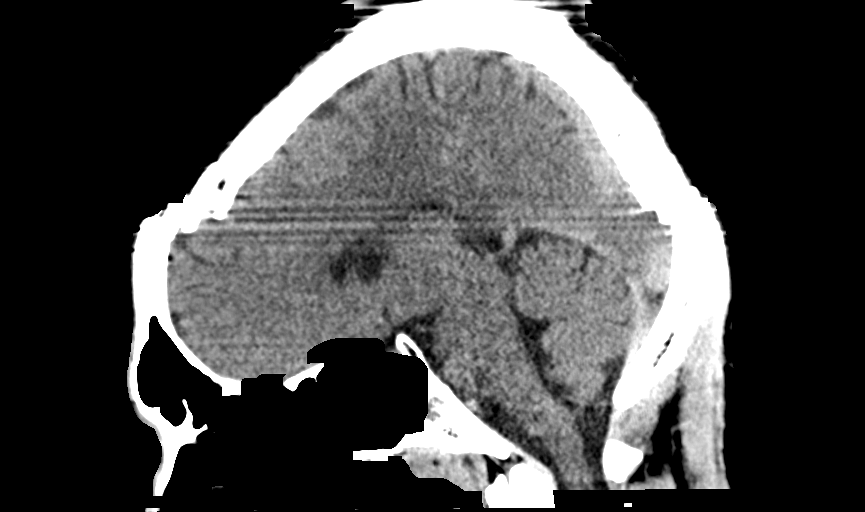
[im 51/77  brain]
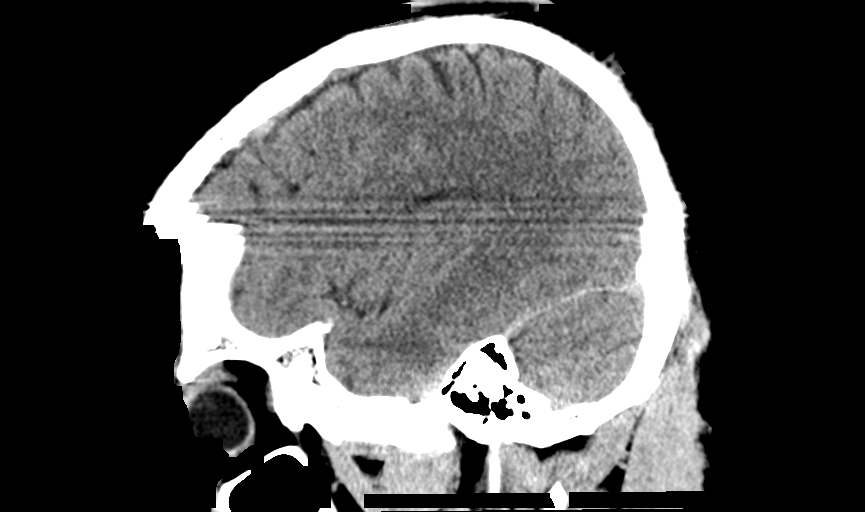

[14 of 47 positions shown; findings below may reference images not displayed]

FINDINGS: Brain: Appears normal without hemorrhage, infarct, mass lesion, mass
effect, midline shift or abnormal extra-axial fluid collection. No
hydrocephalus or pneumocephalus.

Vascular: Negative.

Skull: Intact.

Sinuses/Orbits: Negative.

Other: None.
IMPRESSION: Negative exam.

## 2018-06-13 ENCOUNTER — Emergency Department (HOSPITAL_COMMUNITY)
Admission: EM | Admit: 2018-06-13 | Discharge: 2018-06-13 | Disposition: A | Discharge status: Home / Self Care | Payer: Non-veteran care | Attending: Emergency Medicine | Admitting: Emergency Medicine

## 2018-06-13 ENCOUNTER — Encounter (HOSPITAL_COMMUNITY): Payer: Self-pay

## 2018-06-13 DIAGNOSIS — L03116 Cellulitis of left lower limb: Secondary | ICD-10-CM | POA: Insufficient documentation

## 2018-06-13 DIAGNOSIS — B20 Human immunodeficiency virus [HIV] disease: Secondary | ICD-10-CM | POA: Insufficient documentation

## 2018-06-13 DIAGNOSIS — Z79899 Other long term (current) drug therapy: Secondary | ICD-10-CM | POA: Insufficient documentation

## 2018-06-13 DIAGNOSIS — I10 Essential (primary) hypertension: Secondary | ICD-10-CM | POA: Insufficient documentation

## 2018-06-13 MED ORDER — CEPHALEXIN 500 MG PO CAPS
ORAL_CAPSULE | ORAL | Status: AC
  Filled 2018-06-13: qty 1

## 2018-06-13 MED ORDER — IBUPROFEN 800 MG PO TABS
800.0000 mg | ORAL_TABLET | Freq: Once | ORAL | Status: AC
  Administered 2018-06-13: 800 mg via ORAL
  Filled 2018-06-13: qty 1

## 2018-06-13 MED ORDER — CEPHALEXIN 500 MG PO CAPS
500.0000 mg | ORAL_CAPSULE | Freq: Once | ORAL | Status: AC
  Administered 2018-06-13: 500 mg via ORAL

## 2018-06-13 MED ORDER — DOXYCYCLINE HYCLATE 100 MG PO CAPS: 100.0000 mg | ORAL_CAPSULE | Freq: Two times a day (BID) | ORAL | 0 refills | Status: DC

## 2018-06-13 MED ORDER — CEPHALEXIN 500 MG PO CAPS: 500.0000 mg | ORAL_CAPSULE | Freq: Four times a day (QID) | ORAL | 0 refills | Status: AC

## 2018-06-13 MED ORDER — DOXYCYCLINE HYCLATE 100 MG PO TABS
100.0000 mg | ORAL_TABLET | Freq: Once | ORAL | Status: AC
  Administered 2018-06-13: 100 mg via ORAL

## 2018-06-13 MED ORDER — DOXYCYCLINE HYCLATE 100 MG PO TABS
ORAL_TABLET | ORAL | Status: AC
  Filled 2018-06-13: qty 1

## 2018-06-13 NOTE — ED Provider Notes (Signed)
[facility]-EMERGENCY DEPT Provider Note   CSN: [id number] Arrival date & time: 06/13/18  1839     History   Chief Complaint Chief Complaint  Patient presents with  . Insect Bite  . Leg Pain    HPI Danny Camacho is a 27 y.o. male.  HPI  Danny Camacho is a 27yo male with a history of HIV (CD4 count 250, viral load 21), hypertension who presents to the emergency department for evaluation of increasing left upper thigh swelling and redness.  Patient reports that his symptoms started yesterday and have been gradually worsening.  Complains of burning pain over the area of redness which is worsened with palpation, leg movement and weightbearing.  He has tried topical Benadryl gel and applying alcohol over the area without relief.  He does not remember getting bit by an insect.  Denies any recent injury to the leg.  Denies fevers, chills, numbness, weakness, rash or wound elsewhere, abdominal pain, vomiting.  Denies recent antibiotic use.  Past Medical History:  Diagnosis Date  . HIV (human immunodeficiency virus infection) (HCC)   . Hypertension     Patient Active Problem List   Diagnosis Date Noted  . 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 Acute respiratory failure with hypoxia (HCC)   . Pulmonary infiltrates   . Hemoptysis   . Perirectal abscess s/p I&D 03/29/2017 03/29/2017  . HIV (human immunodeficiency virus infection) (HCC)   . Hypertension     Past Surgical History:  Procedure Laterality Date  . FRACTURE SURGERY     left leg fx w/ rod placement  . INCISION AND DRAINAGE PERIRECTAL ABSCESS N/A 03/29/2017   Procedure: EXAM UNDER ANESTHESIA IRRIGATION AND DEBRIDEMENT RECTAL ABSCESS;  Surgeon: Chevis PrettyPaul Toth III, MD;  Location: [facility];  Service: General;  Laterality: N/A;  . TONSILLECTOMY          Home Medications    Prior to Admission medications   Medication Sig Start Date End Date Taking? Authorizing Provider  acetaminophen (TYLENOL) 500 MG tablet Take 1 tablet (500  mg total) by mouth every 6 (six) hours as needed. Patient not taking: Reported on 03/29/2017 10/28/16   Pisciotta, Joni ReiningNicole, PA-C  benzonatate (TESSALON) 100 MG capsule Take 1 capsule (100 mg total) by mouth every 8 (eight) hours. Patient not taking: Reported on 03/29/2017 09/08/16   Demetrios LollLeaphart, Kenneth T, PA-C  clindamycin (CLEOCIN) 300 MG capsule Take 1 capsule (300 mg total) by mouth 3 (three) times daily. 04/02/17   Ovidio KinNewman, David, MD  dolutegravir (TIVICAY) 50 MG tablet Take 50 mg by mouth daily.    [provider]  emtricitabine-tenofovir AF (DESCOVY) 200-25 MG tablet Take 1 tablet by mouth daily.    [provider]  fluticasone (FLONASE) 50 MCG/ACT nasal spray Place 2 sprays into both nostrils daily as needed for allergies.     [provider]  hydrocortisone (ANUSOL-HC) 2.5 % rectal cream Apply rectally 2 times daily Patient not taking: Reported on 03/29/2017 03/28/17   Earley FavorSchulz, Gail, NP  hydrocortisone cream 1 % Apply to affected area 2 times daily 05/10/17   Roxy HorsemanBrowning, Robert, PA-C  ibuprofen (ADVIL,MOTRIN) 200 MG tablet Take 400 mg by mouth every 6 (six) hours as needed for fever or headache.    [provider]  ibuprofen (ADVIL,MOTRIN) 600 MG tablet Take 1 tablet (600 mg total) by mouth every 6 (six) hours as needed. Patient not taking: Reported on 04/28/2016 08/13/15   Lyndal PulleyKnott, Daniel, MD  lisinopril (PRINIVIL,ZESTRIL) 20 MG tablet Take 1 tablet (20  mg total) by mouth daily. Patient taking differently: Take 10 mg by mouth daily.  11/29/15   Mady Gemma, PA-C  loratadine (CLARITIN) 10 MG tablet Take 10 mg by mouth daily.    [provider]  ondansetron (ZOFRAN ODT) 4 MG disintegrating tablet Take 1 tablet (4 mg total) by mouth every 8 (eight) hours as needed for nausea or vomiting. Patient not taking: Reported on 09/08/2016 04/29/16   Elpidio Anis, PA-C  ondansetron (ZOFRAN ODT) 4 MG disintegrating tablet Take 1 tablet (4 mg total) by mouth every 8  (eight) hours as needed for nausea or vomiting. Patient not taking: Reported on 03/29/2017 03/28/17   Earley Favor, NP  oxyCODONE (OXY IR/ROXICODONE) 5 MG immediate release tablet Take 1-2 tablets (5-10 mg total) by mouth every 6 (six) hours as needed for moderate pain. 04/02/17   Ovidio Kin, MD  traMADol (ULTRAM) 50 MG tablet Take 1 tablet (50 mg total) by mouth every 12 (twelve) hours as needed. 03/28/17   Tomasita Crumble, MD    Family History History reviewed. No pertinent family history.  Social History Social History   Tobacco Use  . Smoking status: Never Smoker  . Smokeless tobacco: Never Used  Substance Use Topics  . Alcohol use: No  . Drug use: Yes    Types: Marijuana     Allergies   Penicillins   Review of Systems Review of Systems  Constitutional: Negative for chills and fever.  Skin: Positive for color change (erythema over left upper thigh).  Neurological: Negative for weakness and numbness.     Physical Exam Updated Vital Signs BP (!) 135/97 (BP Location: Left Arm)   Pulse 97   Temp 98.7 F (37.1 C) (Oral)   Resp 16   Ht 5\' 10"  (1.778 m)   Wt 90.7 kg (200 lb)   SpO2 100%   BMI 28.70 kg/m   Physical Exam  Constitutional: He is oriented to person, place, and time. He appears well-developed and well-nourished. No distress.  No acute distress, nontoxic-appearing.  HENT:  Head: Normocephalic and atraumatic.  Eyes: Right eye exhibits no discharge. Left eye exhibits no discharge.  Pulmonary/Chest: Effort normal. No respiratory distress.  Musculoskeletal:  Strength 5/5 in bilateral knee flexion/extension.  DP pulses 2+ and symmetric bilaterally.  Neurological: He is alert and oriented to person, place, and time. Coordination normal.  Distal sensation to light touch intact in bilateral lower extremities.  Skin: Skin is warm and dry. Capillary refill takes less than 2 seconds. He is not diaphoretic.  Left upper anterior thigh with 5cm round area of erythema,  warmth and induration. No palpable area of fluctuance. No drainage.   Psychiatric: He has a normal mood and affect. His behavior is normal.  Nursing note and vitals reviewed.      ED Treatments / Results  Labs (all labs ordered are listed, but only abnormal results are displayed) Labs Reviewed - No data to display  EKG None  Radiology No results found.  Procedures Procedures (including critical care time)  EMERGENCY DEPARTMENT US SOFT TISSUE INTERPRETATION "Study: Limited Soft Tissue Ultrasound"  INDICATIONS: Pain and Soft tissue infection Multiple views of the body part were obtained in real-time with a multi-frequency linear probe  PERFORMED BY: Myself IMAGES ARCHIVED?: Yes SIDE:Left BODY PART:Lower extremity INTERPRETATION:  No abcess noted and Cellulitis present     Medications Ordered in ED Medications  cephALEXin (KEFLEX) capsule 500 mg (has no administration in time range)  doxycycline (VIBRA-TABS) tablet 100 mg (has  no administration in time range)  ibuprofen (ADVIL,MOTRIN) tablet 800 mg (800 mg Oral Given 06/13/18 1924)     Initial Impression / Assessment and Plan / ED Course  I have reviewed the triage vital signs and the nursing notes.  Pertinent labs & imaging results that were available during my care of the patient were reviewed by me and considered in my medical decision making (see chart for details).     Exam and bedside soft tissue ultrasound consistent with local cellulitis, no abscess.  Patient is afebrile and nontoxic-appearing.  Vital signs stable.  Left lower extremity neurovascularly intact.  He is HIV positive with last CD4 count 250, viral load 20.  He reports that he is adherent with HIV medication.  Will discharge with Keflex and doxycycline. Have counseled him on reasons to return to the emergency department and he agrees and voiced understanding to the above plan and appears reliable for follow-up.  Final Clinical Impressions(s) / ED  Diagnoses   Final diagnoses:  Cellulitis of left lower extremity    ED Discharge Orders        Ordered    cephALEXin (KEFLEX) 500 MG capsule  4 times daily     06/13/18 1951    doxycycline (VIBRAMYCIN) 100 MG capsule  2 times daily     06/13/18 1951       Lawrence Marseilles 06/13/18 1953    Lorre Nick, MD 06/16/18 1734

## 2018-06-13 NOTE — ED Triage Notes (Signed)
Pt c/o increasing localized left upper thigh swelling + redness + burning starting yesterday morning. Denies feeling a bite. Tried topical benadryl cream with no relief. Denies any other sx.

## 2018-06-13 NOTE — Discharge Instructions (Addendum)
You have a skin infection.  Please take both antibiotics as prescribed.  Take antibiotics until they are completely gone.  You can take ibuprofen 800 mg every 6 hours for pain.  He can also take Tylenol 650 mg every 6 hours for pain.  These medications are safe to take together.  Return to the ER if you have any new or concerning symptoms like fever, worsening redness and swelling.

## 2018-09-30 ENCOUNTER — Emergency Department (HOSPITAL_COMMUNITY)
Admission: EM | Admit: 2018-09-30 | Discharge: 2018-10-01 | Disposition: A | Discharge status: Home / Self Care | Payer: Non-veteran care | Attending: Emergency Medicine | Admitting: Emergency Medicine

## 2018-09-30 ENCOUNTER — Encounter (HOSPITAL_COMMUNITY): Payer: Self-pay | Admitting: Emergency Medicine

## 2018-09-30 DIAGNOSIS — Z79899 Other long term (current) drug therapy: Secondary | ICD-10-CM | POA: Insufficient documentation

## 2018-09-30 DIAGNOSIS — R197 Diarrhea, unspecified: Secondary | ICD-10-CM | POA: Insufficient documentation

## 2018-09-30 DIAGNOSIS — I10 Essential (primary) hypertension: Secondary | ICD-10-CM | POA: Insufficient documentation

## 2018-09-30 DIAGNOSIS — Z21 Asymptomatic human immunodeficiency virus [HIV] infection status: Secondary | ICD-10-CM | POA: Diagnosis not present

## 2018-09-30 DIAGNOSIS — R1110 Vomiting, unspecified: Secondary | ICD-10-CM | POA: Diagnosis present

## 2018-09-30 DIAGNOSIS — R112 Nausea with vomiting, unspecified: Secondary | ICD-10-CM | POA: Diagnosis not present

## 2018-09-30 LAB — CBC
HCT: 47.7 % (ref 39.0–52.0)
HEMOGLOBIN: 15.9 g/dL (ref 13.0–17.0)
MCH: 30.1 pg (ref 26.0–34.0)
MCHC: 33.3 g/dL (ref 30.0–36.0)
MCV: 90.3 fL (ref 80.0–100.0)
NRBC: 0 % (ref 0.0–0.2)
PLATELETS: 275 10*3/uL (ref 150–400)
RBC: 5.28 MIL/uL (ref 4.22–5.81)
RDW: 12.4 % (ref 11.5–15.5)
WBC: 9.6 10*3/uL (ref 4.0–10.5)

## 2018-09-30 LAB — COMPREHENSIVE METABOLIC PANEL
ALBUMIN: 4.4 g/dL (ref 3.5–5.0)
ALT: 15 U/L (ref 0–44)
AST: 21 U/L (ref 15–41)
Alkaline Phosphatase: 72 U/L (ref 38–126)
Anion gap: 4 — ABNORMAL LOW (ref 5–15)
BILIRUBIN TOTAL: 1.2 mg/dL (ref 0.3–1.2)
BUN: 7 mg/dL (ref 6–20)
CALCIUM: 9 mg/dL (ref 8.9–10.3)
CHLORIDE: 110 mmol/L (ref 98–111)
CO2: 26 mmol/L (ref 22–32)
Creatinine, Ser: 1.13 mg/dL (ref 0.61–1.24)
GFR calc Af Amer: >60 mL/min (ref >60)
GFR calc non Af Amer: >60 mL/min (ref >60)
GLUCOSE: 97 mg/dL (ref 70–99)
POTASSIUM: 3.8 mmol/L (ref 3.5–5.1)
Sodium: 140 mmol/L (ref 135–145)
TOTAL PROTEIN: 7.8 g/dL (ref 6.5–8.1)

## 2018-09-30 LAB — URINALYSIS, ROUTINE W REFLEX MICROSCOPIC
Bacteria, UA: NONE SEEN
Bilirubin Urine: NEGATIVE
GLUCOSE, UA: NEGATIVE mg/dL
HGB URINE DIPSTICK: NEGATIVE
KETONES UR: NEGATIVE mg/dL
Leukocytes, UA: NEGATIVE
Nitrite: NEGATIVE
PH: 9 — AB (ref 5.0–8.0)
PROTEIN: 100 mg/dL — AB
Specific Gravity, Urine: 1.024 (ref 1.005–1.030)

## 2018-09-30 LAB — LIPASE, BLOOD: LIPASE: 32 U/L (ref 11–51)

## 2018-09-30 MED ORDER — ONDANSETRON HCL 4 MG/2ML IJ SOLN
4.0000 mg | Freq: Once | INTRAMUSCULAR | Status: AC
  Administered 2018-09-30: 4 mg via INTRAVENOUS
  Filled 2018-09-30: qty 2

## 2018-09-30 MED ORDER — SODIUM CHLORIDE 0.9 % IV BOLUS (SEPSIS)
1000.0000 mL | Freq: Once | INTRAVENOUS | Status: AC
  Administered 2018-09-30: 1000 mL via INTRAVENOUS

## 2018-09-30 MED ORDER — FAMOTIDINE IN NACL 20-0.9 MG/50ML-% IV SOLN
20.0000 mg | Freq: Once | INTRAVENOUS | Status: AC
  Administered 2018-09-30: 20 mg via INTRAVENOUS
  Filled 2018-09-30: qty 50

## 2018-09-30 MED ORDER — SODIUM CHLORIDE 0.9 % IV SOLN
1000.0000 mL | INTRAVENOUS | Status: DC
  Administered 2018-10-01: 1000 mL via INTRAVENOUS

## 2018-09-30 NOTE — ED Triage Notes (Signed)
Per [facility] pt from home for abd pain with n/v that started around 5am and getting worse.  CBG 90.

## 2018-09-30 NOTE — ED Provider Notes (Signed)
[facility]-EMERGENCY DEPT Provider Note   CSN: [id number] Arrival date & time: 09/30/18  1633     History   Chief Complaint Chief Complaint  Patient presents with  . Abdominal Pain  . Emesis    HPI Danny Camacho is a 27 y.o. male.  HPI Patient has HIV.  He reports he is compliant with medications and his viral load is undetectable.  He reports that he started developing vomiting and diarrhea today.  He has had multiple episodes of both.  He denies any blood.  He reports he has had lower left abdominal pain and epigastric pain.  Feels like a knot.  No fever that he is aware of.  He was concerned because he had a rectal abscess last year and the symptoms started very similar.  Denies he had any rectal drainage or discharge. Past Medical History:  Diagnosis Date  . HIV (human immunodeficiency virus infection) (HCC)   . Hypertension     Patient Active Problem List   Diagnosis Date Noted  . 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 Acute respiratory failure with hypoxia (HCC)   . Pulmonary infiltrates   . Hemoptysis   . Perirectal abscess s/p I&D 03/29/2017 03/29/2017  . HIV (human immunodeficiency virus infection) (HCC)   . Hypertension     Past Surgical History:  Procedure Laterality Date  . FRACTURE SURGERY     left leg fx w/ rod placement  . INCISION AND DRAINAGE PERIRECTAL ABSCESS N/A 03/29/2017   Procedure: EXAM UNDER ANESTHESIA IRRIGATION AND DEBRIDEMENT RECTAL ABSCESS;  Surgeon: Chevis Pretty III, MD;  Location: [facility];  Service: General;  Laterality: N/A;  . TONSILLECTOMY          Home Medications    Prior to Admission medications   Medication Sig Start Date End Date Taking? Authorizing Provider  acetaminophen (TYLENOL) 500 MG tablet Take 1 tablet (500 mg total) by mouth every 6 (six) hours as needed. Patient not taking: Reported on 03/29/2017 10/28/16   Pisciotta, Joni Reining, PA-C  benzonatate (TESSALON) 100 MG capsule Take 1 capsule (100 mg total) by mouth every  8 (eight) hours. Patient not taking: Reported on 03/29/2017 09/08/16   Demetrios Loll T, PA-C  clindamycin (CLEOCIN) 300 MG capsule Take 1 capsule (300 mg total) by mouth 3 (three) times daily. 04/02/17   Ovidio Kin, MD  dolutegravir (TIVICAY) 50 MG tablet Take 50 mg by mouth daily.    [provider]  doxycycline (VIBRAMYCIN) 100 MG capsule Take 1 capsule (100 mg total) by mouth 2 (two) times daily. 06/13/18   Kellie Shropshire, PA-C  emtricitabine-tenofovir AF (DESCOVY) 200-25 MG tablet Take 1 tablet by mouth daily.    [provider]  fluticasone (FLONASE) 50 MCG/ACT nasal spray Place 2 sprays into both nostrils daily as needed for allergies.     [provider]  hydrocortisone (ANUSOL-HC) 2.5 % rectal cream Apply rectally 2 times daily Patient not taking: Reported on 03/29/2017 03/28/17   Earley Favor, NP  hydrocortisone cream 1 % Apply to affected area 2 times daily 05/10/17   Roxy Horseman, PA-C  ibuprofen (ADVIL,MOTRIN) 200 MG tablet Take 400 mg by mouth every 6 (six) hours as needed for fever or headache.    [provider]  ibuprofen (ADVIL,MOTRIN) 600 MG tablet Take 1 tablet (600 mg total) by mouth every 6 (six) hours as needed. Patient not taking: Reported on 04/28/2016 08/13/15   Lyndal Pulley, MD  lisinopril (PRINIVIL,ZESTRIL) 20 MG tablet Take 1 tablet (20 mg  total) by mouth daily. Patient taking differently: Take 10 mg by mouth daily.  11/29/15   Mady Gemma, PA-C  loratadine (CLARITIN) 10 MG tablet Take 10 mg by mouth daily.    [provider]  ondansetron (ZOFRAN ODT) 4 MG disintegrating tablet Take 1 tablet (4 mg total) by mouth every 8 (eight) hours as needed for nausea or vomiting. Patient not taking: Reported on 09/08/2016 04/29/16   Elpidio Anis, PA-C  ondansetron (ZOFRAN ODT) 4 MG disintegrating tablet Take 1 tablet (4 mg total) by mouth every 8 (eight) hours as needed for nausea or vomiting. Patient not taking: Reported  on 03/29/2017 03/28/17   Earley Favor, NP  oxyCODONE (OXY IR/ROXICODONE) 5 MG immediate release tablet Take 1-2 tablets (5-10 mg total) by mouth every 6 (six) hours as needed for moderate pain. 04/02/17   Ovidio Kin, MD  traMADol (ULTRAM) 50 MG tablet Take 1 tablet (50 mg total) by mouth every 12 (twelve) hours as needed. 03/28/17   Tomasita Crumble, MD    Family History No family history on file.  Social History Social History   Tobacco Use  . Smoking status: Never Smoker  . Smokeless tobacco: Never Used  Substance Use Topics  . Alcohol use: No  . Drug use: Yes    Types: Marijuana     Allergies   Penicillins   Review of Systems Review of Systems 10 Systems reviewed and are negative for acute change except as noted in the HPI.   Physical Exam Updated Vital Signs BP (!) 143/100 (BP Location: Left Arm) Comment: Will notify primary RN  Pulse 67   Temp 98.4 F (36.9 C) (Oral)   Resp 16   SpO2 100%   Physical Exam  Constitutional: He is oriented to person, place, and time.  Patient is clinically well in appearance.  He is alert and nontoxic.  Mental status clear.  No respiratory distress.  HENT:  Head: Normocephalic and atraumatic.  Nose: Nose normal.  Mouth/Throat: Oropharynx is clear and moist.  Eyes: EOM are normal.  Neck: Neck supple.  Cardiovascular: Normal rate, regular rhythm, normal heart sounds and intact distal pulses.  Pulmonary/Chest: Effort normal and breath sounds normal.  Abdominal:  Left lateral and lower abdomen moderately tender to palpation without guarding.  Suprapubic slightly tender without guarding.  Genitourinary:  Genitourinary Comments: Normal perianal area.  No lesions no drainage no erythema.  Digital exam: No mass or fullness within the anal or rectal vault.  No blood.  Trace brown stool.  Musculoskeletal: Normal range of motion. He exhibits no edema or tenderness.  Neurological: He is alert and oriented to person, place, and time. No cranial  nerve deficit. He exhibits normal muscle tone. Coordination normal.  Skin: Skin is warm and dry.  Psychiatric: He has a normal mood and affect.     ED Treatments / Results  Labs (all labs ordered are listed, but only abnormal results are displayed) Labs Reviewed  COMPREHENSIVE METABOLIC PANEL - Abnormal; Notable for the following components:      Result Value   Anion gap 4 (*)    All other components within normal limits  LIPASE, BLOOD  CBC  URINALYSIS, ROUTINE W REFLEX MICROSCOPIC  POC OCCULT BLOOD, ED    EKG None  Radiology No results found.  Procedures Procedures (including critical care time)  Medications Ordered in ED Medications  sodium chloride 0.9 % bolus 1,000 mL (has no administration in time range)    Followed by  sodium chloride  0.9 % bolus 1,000 mL (has no administration in time range)    Followed by  0.9 %  sodium chloride infusion (has no administration in time range)  famotidine (PEPCID) IVPB 20 mg premix (has no administration in time range)  ondansetron (ZOFRAN) injection 4 mg (has no administration in time range)     Initial Impression / Assessment and Plan / ED Course  I have reviewed the triage vital signs and the nursing notes.  Pertinent labs & imaging results that were available during my care of the patient were reviewed by me and considered in my medical decision making (see chart for details).    Patient has had onset of vomiting and diarrheal illness today.  His clinical appearance is good.  He is nontoxic and alert.  Abdominal examination is nonsurgical.  Diagnostic tests are within normal limits.  Patient had concern for possible very rectal abscess since he has had it in the past.  Rectal exam is normal.  No mass and no discharge from the rectal vault.  She reports his HIV has been undetectable.  At this time, I most suspect viral gastroenteritis.  Will treat symptomatically.  Patient is instructed to follow-up with his factious disease  doctor ASAP.  He is to return to the emergency department if he has localizing pain, fever or advancing symptoms.  Final Clinical Impressions(s) / ED Diagnoses   Final diagnoses:  Nausea vomiting and diarrhea    ED Discharge Orders    None       Arby Barrette, MD 10/01/18 [phone]

## 2018-09-30 NOTE — ED Notes (Signed)
Lillia Abed RN made aware of previously charted vitals.

## 2018-10-01 LAB — POC OCCULT BLOOD, ED: FECAL OCCULT BLD: NEGATIVE

## 2018-10-01 MED ORDER — PROMETHAZINE HCL 25 MG PO TABS: 25.0000 mg | ORAL_TABLET | Freq: Four times a day (QID) | ORAL | 0 refills | Status: DC | PRN

## 2018-10-01 MED ORDER — FAMOTIDINE 20 MG PO TABS: 20.0000 mg | ORAL_TABLET | Freq: Two times a day (BID) | ORAL | 0 refills | Status: DC

## 2018-10-01 NOTE — ED Notes (Signed)
Pt asking to leave. Told pt he needed to get discharge papers and let staff remove IV. When nurse left to get pt papers, pt removed IV and refused to sign out or allow nurse to go over discharge instructions.

## 2020-10-26 ENCOUNTER — Encounter (HOSPITAL_COMMUNITY): Payer: Self-pay

## 2020-10-26 ENCOUNTER — Emergency Department (HOSPITAL_COMMUNITY)
Admission: EM | Admit: 2020-10-26 | Discharge: 2020-10-26 | Disposition: A | Discharge status: Home / Self Care | Payer: No Typology Code available for payment source | Attending: Emergency Medicine | Admitting: Emergency Medicine

## 2020-10-26 ENCOUNTER — Other Ambulatory Visit: Payer: Self-pay

## 2020-10-26 DIAGNOSIS — I10 Essential (primary) hypertension: Secondary | ICD-10-CM | POA: Insufficient documentation

## 2020-10-26 DIAGNOSIS — R1084 Generalized abdominal pain: Secondary | ICD-10-CM | POA: Diagnosis present

## 2020-10-26 DIAGNOSIS — Z21 Asymptomatic human immunodeficiency virus [HIV] infection status: Secondary | ICD-10-CM | POA: Insufficient documentation

## 2020-10-26 DIAGNOSIS — R112 Nausea with vomiting, unspecified: Secondary | ICD-10-CM | POA: Diagnosis not present

## 2020-10-26 DIAGNOSIS — K2920 Alcoholic gastritis without bleeding: Secondary | ICD-10-CM | POA: Diagnosis not present

## 2020-10-26 DIAGNOSIS — Z79899 Other long term (current) drug therapy: Secondary | ICD-10-CM | POA: Diagnosis not present

## 2020-10-26 LAB — COMPREHENSIVE METABOLIC PANEL
ALT: 26 U/L (ref 0–44)
AST: 26 U/L (ref 15–41)
Albumin: 4.5 g/dL (ref 3.5–5.0)
Alkaline Phosphatase: 88 U/L (ref 38–126)
Anion gap: 8 (ref 5–15)
BUN: 10 mg/dL (ref 6–20)
CO2: 25 mmol/L (ref 22–32)
Calcium: 9 mg/dL (ref 8.9–10.3)
Chloride: 107 mmol/L (ref 98–111)
Creatinine, Ser: 1.19 mg/dL (ref 0.61–1.24)
GFR, Estimated: >60 mL/min (ref >60)
Glucose, Bld: 115 mg/dL — ABNORMAL HIGH (ref 70–99)
Potassium: 4.4 mmol/L (ref 3.5–5.1)
Sodium: 140 mmol/L (ref 135–145)
Total Bilirubin: 1.4 mg/dL — ABNORMAL HIGH (ref 0.3–1.2)
Total Protein: 8 g/dL (ref 6.5–8.1)

## 2020-10-26 LAB — URINALYSIS, ROUTINE W REFLEX MICROSCOPIC
Bilirubin Urine: NEGATIVE
Glucose, UA: NEGATIVE mg/dL
Hgb urine dipstick: NEGATIVE
Ketones, ur: NEGATIVE mg/dL
Leukocytes,Ua: NEGATIVE
Nitrite: NEGATIVE
Protein, ur: 30 mg/dL — AB
Specific Gravity, Urine: 1.02 (ref 1.005–1.030)
pH: 9 — ABNORMAL HIGH (ref 5.0–8.0)

## 2020-10-26 LAB — CBC
HCT: 48.2 % (ref 39.0–52.0)
Hemoglobin: 16.4 g/dL (ref 13.0–17.0)
MCH: 30.5 pg (ref 26.0–34.0)
MCHC: 34 g/dL (ref 30.0–36.0)
MCV: 89.6 fL (ref 80.0–100.0)
Platelets: 283 10*3/uL (ref 150–400)
RBC: 5.38 MIL/uL (ref 4.22–5.81)
RDW: 12.3 % (ref 11.5–15.5)
WBC: 7.3 10*3/uL (ref 4.0–10.5)
nRBC: 0 % (ref 0.0–0.2)

## 2020-10-26 LAB — LIPASE, BLOOD: Lipase: 26 U/L (ref 11–51)

## 2020-10-26 LAB — ETHANOL: Alcohol, Ethyl (B): <10 mg/dL (ref <10)

## 2020-10-26 MED ORDER — PROMETHAZINE HCL 25 MG/ML IJ SOLN
12.5000 mg | Freq: Once | INTRAMUSCULAR | Status: AC
  Administered 2020-10-26: 12.5 mg via INTRAVENOUS
  Filled 2020-10-26: qty 1

## 2020-10-26 MED ORDER — SODIUM CHLORIDE 0.9 % IV BOLUS
1000.0000 mL | Freq: Once | INTRAVENOUS | Status: AC
  Administered 2020-10-26: 1000 mL via INTRAVENOUS

## 2020-10-26 MED ORDER — FAMOTIDINE IN NACL 20-0.9 MG/50ML-% IV SOLN
20.0000 mg | Freq: Once | INTRAVENOUS | Status: AC
  Administered 2020-10-26: 20 mg via INTRAVENOUS
  Filled 2020-10-26: qty 50

## 2020-10-26 MED ORDER — PANTOPRAZOLE SODIUM 20 MG PO TBEC: 20.0000 mg | DELAYED_RELEASE_TABLET | Freq: Every day | ORAL | 0 refills | Status: DC

## 2020-10-26 NOTE — ED Notes (Signed)
Pt had a cup of water done well 

## 2020-10-26 NOTE — ED Provider Notes (Signed)
[facility]-EMERGENCY DEPT Provider Note   CSN: [id number] Arrival date & time: 10/26/20  5621     History Chief Complaint  Patient presents with  . Abdominal Pain  . Emesis    Danny Camacho is a 29 y.o. male with PMHx HIV and HTN who presents to the ED today with complaint of sudden onset, constant, sharp, diffuse abdominal pain that woke patient up out of his sleep around 5:40 AM this morning. Pt also complains of nausea and TNTC episodes of NBNB emesis. He mentions going drinking last night with a friend and drinking approximately 3-4 shots of alcohol - he does not know what type of liquor it was but states it was "brown" in color. Pt states he does not normally drink that much. Denies any other alcohol or drug use. He attempted to take his oral phenergan at home (reports history of gastroparesis; no history of diabetes) however he vomited immediately afterwards and decided to come to the ED for further evaluation. Pt denies fevers, chills, urinary sx, diarrhea, constipation, testicular pain/swelling, or any other associated symptoms.   The history is provided by the patient and medical records.       Past Medical History:  Diagnosis Date  . HIV (human immunodeficiency virus infection) (HCC)   . Hypertension     Patient Active Problem List   Diagnosis Date Noted  . 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 Acute respiratory failure with hypoxia (HCC)   . Pulmonary infiltrates   . Hemoptysis   . Perirectal abscess s/p I&D 03/29/2017 03/29/2017  . HIV (human immunodeficiency virus infection) (HCC)   . Hypertension     Past Surgical History:  Procedure Laterality Date  . FRACTURE SURGERY     left leg fx w/ rod placement  . INCISION AND DRAINAGE PERIRECTAL ABSCESS N/A 03/29/2017   Procedure: EXAM UNDER ANESTHESIA IRRIGATION AND DEBRIDEMENT RECTAL ABSCESS;  Surgeon: Chevis Pretty III, MD;  Location: [facility];  Service: General;  Laterality: N/A;  . TONSILLECTOMY          History reviewed. No pertinent family history.  Social History   Tobacco Use  . Smoking status: Never Smoker  . Smokeless tobacco: Never Used  Vaping Use  . Vaping Use: Never used  Substance Use Topics  . Alcohol use: Yes  . Drug use: Yes    Types: Marijuana    Home Medications Prior to Admission medications   Medication Sig Start Date End Date Taking? Authorizing Provider  dolutegravir (TIVICAY) 50 MG tablet Take 50 mg by mouth daily.   Yes [provider]  emtricitabine-tenofovir AF (DESCOVY) 200-25 MG tablet Take 1 tablet by mouth daily.   Yes [provider]  escitalopram (LEXAPRO) 10 MG tablet Take 10 mg by mouth daily. 06/10/20  Yes [provider]  ibuprofen (ADVIL,MOTRIN) 200 MG tablet Take 400 mg by mouth every 6 (six) hours as needed for fever or headache.   Yes [provider]  lisinopril (PRINIVIL,ZESTRIL) 20 MG tablet Take 1 tablet (20 mg total) by mouth daily. Patient taking differently: Take 10 mg by mouth daily.  11/29/15  Yes Mady Gemma, PA-C  pantoprazole (PROTONIX) 20 MG tablet Take 1 tablet (20 mg total) by mouth daily for 15 days. 10/26/20 11/10/20  Tanda Rockers, PA-C    Allergies    Penicillins  Review of Systems   Review of Systems  Constitutional: Negative for chills and fever.  Gastrointestinal: Positive for abdominal pain, nausea and vomiting. Negative for constipation and  diarrhea.  Genitourinary: Negative for enuresis, flank pain, frequency, scrotal swelling and testicular pain.  All other systems reviewed and are negative.   Physical Exam Updated Vital Signs BP (!) 158/103   Pulse 89   Temp 97.9 F (36.6 C) (Oral)   Resp 16   Ht 5\' 9"  (1.753 m)   Wt 97.5 kg   SpO2 100%   BMI 31.75 kg/m   Physical Exam Vitals and nursing note reviewed.  Constitutional:      Appearance: He is not ill-appearing or diaphoretic.  HENT:     Head: Normocephalic and atraumatic.  Eyes:      Conjunctiva/sclera: Conjunctivae normal.  Cardiovascular:     Rate and Rhythm: Normal rate and regular rhythm.     Heart sounds: Normal heart sounds.  Pulmonary:     Effort: Pulmonary effort is normal.     Breath sounds: Normal breath sounds. No wheezing, rhonchi or rales.  Abdominal:     General: Abdomen is flat.     Palpations: Abdomen is soft.     Tenderness: There is generalized abdominal tenderness. There is no right CVA tenderness, left CVA tenderness, guarding or rebound.  Musculoskeletal:     Cervical back: Neck supple.  Skin:    General: Skin is warm and dry.  Neurological:     Mental Status: He is alert.     ED Results / Procedures / Treatments   Labs (all labs ordered are listed, but only abnormal results are displayed) Labs Reviewed  COMPREHENSIVE METABOLIC PANEL - Abnormal; Notable for the following components:      Result Value   Glucose, Bld 115 (*)    Total Bilirubin 1.4 (*)    All other components within normal limits  URINALYSIS, ROUTINE W REFLEX MICROSCOPIC - Abnormal; Notable for the following components:   pH 9.0 (*)    Protein, ur 30 (*)    Bacteria, UA RARE (*)    All other components within normal limits  LIPASE, BLOOD  CBC  ETHANOL    EKG None  Radiology No results found.  Procedures Procedures (including critical care time)  Medications Ordered in ED Medications  sodium chloride 0.9 % bolus 1,000 mL (1,000 mLs Intravenous New Bag/Given 10/26/20 1040)  promethazine (PHENERGAN) injection 12.5 mg (12.5 mg Intravenous Given 10/26/20 1040)  famotidine (PEPCID) IVPB 20 mg premix (20 mg Intravenous New Bag/Given 10/26/20 1040)    ED Course  I have reviewed the triage vital signs and the nursing notes.  Pertinent labs & imaging results that were available during my care of the patient were reviewed by me and considered in my medical decision making (see chart for details).    MDM Rules/Calculators/A&P                           29 year old male with a history of HIV who presents to the ED today with complaint of nausea, vomiting, abdominal pain that woke him up out of his sleep today.  He did drink approximately 3-4 shots of liquor last night around midnight, states he does not normally drink this much.  Came to the ED after he was unable to keep down his oral Phenergan that is prescribed for gastroparesis.  States this feels different than typical gastroparesis flares, patient does not have a history of diabetes per chart review.  On arrival to the ED patient is afebrile, nontachycardic nontachypneic.  He does appear slightly uncomfortable on exam however no acute  distress.  He has diffuse abdominal tenderness palpation on exam, no rebound or guarding.  Doubt acute abdomen at this time.  We will plan for labs, fluids, antiemetics, Pepcid in the setting I suspect alcoholic gastritis is causing symptoms.  CBC without leukocytosis. Hgb stable at 16.4 CMP without electrolyte abnormalities. LFTs within normal limits Lipase 26 U/A negative for infection ETOH negative  On reevaluation pt resting comfortably; has been able to tolerate PO without difficulty. Abdomen soft and nontender at this time. Will discharge home with close PCP follow up. Pt is in agreement with plan and stable for discharge.   This note was prepared using Dragon voice recognition software and may include unintentional dictation errors due to the inherent limitations of voice recognition software.  Final Clinical Impression(s) / ED Diagnoses Final diagnoses:  Acute alcoholic gastritis without hemorrhage    Rx / DC Orders ED Discharge Orders         Ordered    pantoprazole (PROTONIX) 20 MG tablet  Daily        10/26/20 1347           Discharge Instructions     Please follow up with your PCP regarding your ED visit today. Take your home nausea medication as needed. I have prescribed medication that helps reduce the acid in your stomach that   may help however it is suspected that your alcohol use caused your symptoms today  Return to the ED for any worsening symptoms       Tanda Rockers, PA-C 10/26/20 1350    Gwyneth Sprout, MD 10/28/20 1018

## 2020-10-26 NOTE — ED Triage Notes (Addendum)
Patient c/o sudden onset of generalized abdominal pain and vomiting since 0500 today. Patient added that he drank a lot of alcohol last night.

## 2020-10-26 NOTE — Discharge Instructions (Signed)
Please follow up with your PCP regarding your ED visit today. Take your home nausea medication as needed. I have prescribed medication that helps reduce the acid in your stomach that  may help however it is suspected that your alcohol use caused your symptoms today  Return to the ED for any worsening symptoms

## 2021-07-11 ENCOUNTER — Other Ambulatory Visit: Payer: Self-pay

## 2021-07-11 ENCOUNTER — Emergency Department (HOSPITAL_COMMUNITY)
Admission: EM | Admit: 2021-07-11 | Discharge: 2021-07-11 | Disposition: A | Discharge status: Home / Self Care | Payer: No Typology Code available for payment source | Attending: Emergency Medicine | Admitting: Emergency Medicine

## 2021-07-11 ENCOUNTER — Encounter (HOSPITAL_COMMUNITY): Payer: Self-pay

## 2021-07-11 DIAGNOSIS — I10 Essential (primary) hypertension: Secondary | ICD-10-CM | POA: Diagnosis not present

## 2021-07-11 DIAGNOSIS — Z79899 Other long term (current) drug therapy: Secondary | ICD-10-CM | POA: Diagnosis not present

## 2021-07-11 DIAGNOSIS — W1789XA Other fall from one level to another, initial encounter: Secondary | ICD-10-CM | POA: Insufficient documentation

## 2021-07-11 DIAGNOSIS — M25571 Pain in right ankle and joints of right foot: Secondary | ICD-10-CM | POA: Diagnosis present

## 2021-07-11 DIAGNOSIS — Z8781 Personal history of (healed) traumatic fracture: Secondary | ICD-10-CM | POA: Insufficient documentation

## 2021-07-11 NOTE — ED Notes (Signed)
Patient left before complete discharge process could be completed.

## 2021-07-11 NOTE — ED Provider Notes (Signed)
[facility]-EMERGENCY DEPT Provider Note   CSN: [id number] Arrival date & time: 07/11/21  1023     History Chief Complaint  Patient presents with   Danny Camacho    Danny Camacho is a 30 y.o. male.  HPI   Pt is a 30 y/o male with a h/o HIV, HTN, who presents to the ED today for eval of right ankle discomfort that started 3 months ago after pt fell off a porch. States he has been following with the [place] and has received multiple different diagnosis and treatment plans during his multiple orthopedic follow-ups. He was told that he would likely need surgery however given the confusion over his prior diagnosis thus far he wanted to come here for a second opinion. He is still having pain to the right ankle and states he has not been able to walk since his injury occurred.  MRI of the right ankle report completed on 06/30/21 is as follows, "1.  Old, unhealed avulsion fracture of the anterior distal fibula with functional tear of the anterior talofibular ligament which inserts upon this avulsion. 2.  Sprain/partial tear of the posterior talofibular and calcaneofibular ligaments.   3.  Mild peroneal tendinosis and reactive edema overlying the superior and inferior peroneal retinacula without focal tear.  4.  Mild chronic Achilles tendinosis and peritendinitis. 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 No pertinent family history.  Social History   Tobacco Use   Smoking status: Never   Smokeless tobacco: Never  Vaping Use   Vaping Use: Never used  Substance Use Topics   Alcohol use: Not Currently   Drug use: Yes    Types: Marijuana    Home Medications Prior to Admission medications   Medication Sig Start Date End Date Taking? Authorizing Provider  dolutegravir (TIVICAY) 50 MG tablet Take 50 mg by mouth daily.    [provider]  emtricitabine-tenofovir AF (DESCOVY) 200-25 MG tablet Take 1 tablet by mouth daily.    [provider]  escitalopram (LEXAPRO) 10 MG tablet Take 10 mg by mouth daily. 06/10/20   [provider]  ibuprofen (ADVIL,MOTRIN) 200 MG tablet Take 400 mg by mouth every 6 (six) hours as needed for fever or headache.    [provider]  lisinopril (PRINIVIL,ZESTRIL) 20 MG tablet Take 1 tablet (20 mg total) by mouth daily. Patient taking differently: Take 10 mg by mouth daily.  11/29/15   Mady Gemma, PA-C  pantoprazole (PROTONIX) 20 MG tablet Take 1 tablet (20 mg total) by mouth daily for 15 days. 10/26/20 11/10/20  Tanda Rockers, PA-C    Allergies    Penicillins  Review of Systems  Review of Systems  Constitutional:  Negative for fever.  Musculoskeletal:        Right ankle pain  Skin:  Negative for wound.  Neurological:  Positive for numbness.   Physical Exam Updated Vital Signs BP (!) 153/101 (BP Location: Right Arm)   Pulse 79   Temp 98.2 F (36.8 C) (Oral)   Resp 16   Ht 5\' 10"  (1.778 m)   Wt 112 kg   SpO2 100%   BMI 35.44 kg/m   Physical Exam Constitutional:      General: He is not in acute distress.    Appearance: He is well-developed.  Eyes:     Conjunctiva/sclera: Conjunctivae normal.  Cardiovascular:     Rate and Rhythm: Normal rate.  Pulmonary:      Effort: Pulmonary effort is normal.  Musculoskeletal:     Comments: Mild ttp to the lateral malleolus. Nvi distally. Decreased ROM of the right ankle with dorsiflexion and plantarflexion.  Skin:    General: Skin is warm and dry.  Neurological:     Mental Status: He is alert and oriented to person, place, and time.    ED Results / Procedures / Treatments   Labs (all labs ordered are listed, but only abnormal results are displayed) Labs Reviewed - No data to display  EKG None  Radiology No results found.  Procedures Procedures   SPLINT APPLICATION Date/Time: 3:00 PM Authorized by: Consent: Verbal consent obtained. Risks and benefits: risks, benefits and alternatives were discussed Consent given by: patient Splint applied by: orthopedic technician Location details: right ankle Splint type: aso ankle splint Supplies used: aso ankle splint Post-procedure: The splinted body part was neurovascularly unchanged following the procedure. Patient tolerance: Patient tolerated the procedure well with no immediate complications.    Medications Ordered in ED Medications - No data to display  ED Course  I have reviewed the triage vital signs and the nursing notes.  Pertinent labs & imaging results that were available during my care of the patient were reviewed by me and considered in my medical decision making (see chart for details).    MDM Rules/Calculators/A&P                          30 y/o male presenting for evaluation of right ankle pain. Head injury three months ago and [facility]. Had an MRI recently with findings as above. He is here for a second opinion.  Discussed case with Dr. 34 with orthopedics. He states that the patient does not need to be weight-bearing anymore due to the distant diagnosis of injury.  patient was placed in aso splint and advised to follow-up with Dr. Yevette Edwards or his previous orthopedic  doctor at the [facility]. He is advised to return to the emergency department for any new or worsening symptoms in the meantime.  Final Clinical Impression(s) / ED Diagnoses Final diagnoses:  Acute right ankle pain    Rx / DC Orders ED Discharge Orders     None        [facility], PA-C 07/11/21 1500    09/10/21, MD 07/11/21 1811

## 2021-07-11 NOTE — Discharge Instructions (Addendum)
Please follow up with the orthopedic provider within 5-7 days for re-evaluation of your symptoms. If you do not have a primary care provider, information for a healthcare clinic has been provided for you to make arrangements for follow up care. Please return to the emergency department for any new or worsening symptoms.

## 2021-07-11 NOTE — Progress Notes (Signed)
Orthopedic Tech Progress Note Patient Details:  Danny Camacho May 09, 1991 [id number] Patient was given a new pair of crutches due to only have one of his own.  Ortho Devices Type of Ortho Device: ASO, Crutches Ortho Device/Splint Location: Right Ankle Ortho Device/Splint Interventions: Application   Post Interventions Patient Tolerated: Well, Ambulated well  Nijee Heatwole E Jadin Creque 07/11/2021, 3:26 PM

## 2021-07-11 NOTE — ED Triage Notes (Addendum)
Patient states he fell off of his porch on 04/10/21 and then he fell a second time. Patient went to the [place] and was told his right ankle was fracture. Patient was told he had a walker boot on. Patient states he fell again at work when an employee kicked a chair out from under him.  and saw a physician again on 05/16/21. patient states he has pain when he puts a shoe on or ambulates.   Patient had a MRI on 05/31/21 and got results on 6/24 stating that  there was an old unhealed avulsion fracture, tenditis, and swelling present.

## 2021-07-29 DIAGNOSIS — S93409A Sprain of unspecified ligament of unspecified ankle, initial encounter: Secondary | ICD-10-CM

## 2022-07-13 ENCOUNTER — Inpatient Hospital Stay (HOSPITAL_COMMUNITY)
Admission: EM | Admit: 2022-07-13 | Discharge: 2022-07-16 | DRG: 349 | Disposition: A | Discharge status: Home / Self Care | Payer: No Typology Code available for payment source | Attending: Surgery | Admitting: Surgery

## 2022-07-13 ENCOUNTER — Emergency Department (HOSPITAL_COMMUNITY): Payer: No Typology Code available for payment source

## 2022-07-13 ENCOUNTER — Other Ambulatory Visit: Payer: Self-pay

## 2022-07-13 ENCOUNTER — Encounter (HOSPITAL_COMMUNITY): Payer: Self-pay

## 2022-07-13 ENCOUNTER — Emergency Department (HOSPITAL_COMMUNITY)
Admission: EM | Admit: 2022-07-13 | Discharge: 2022-07-13 | Disposition: A | Discharge status: Home / Self Care | Payer: No Typology Code available for payment source | Attending: Emergency Medicine | Admitting: Emergency Medicine

## 2022-07-13 DIAGNOSIS — K611 Rectal abscess: Principal | ICD-10-CM | POA: Diagnosis present

## 2022-07-13 DIAGNOSIS — Z21 Asymptomatic human immunodeficiency virus [HIV] infection status: Secondary | ICD-10-CM | POA: Diagnosis present

## 2022-07-13 DIAGNOSIS — K612 Anorectal abscess: Secondary | ICD-10-CM | POA: Diagnosis not present

## 2022-07-13 DIAGNOSIS — K6289 Other specified diseases of anus and rectum: Secondary | ICD-10-CM

## 2022-07-13 DIAGNOSIS — I10 Essential (primary) hypertension: Secondary | ICD-10-CM | POA: Diagnosis present

## 2022-07-13 DIAGNOSIS — K644 Residual hemorrhoidal skin tags: Secondary | ICD-10-CM | POA: Insufficient documentation

## 2022-07-13 DIAGNOSIS — B20 Human immunodeficiency virus [HIV] disease: Secondary | ICD-10-CM | POA: Diagnosis present

## 2022-07-13 DIAGNOSIS — T884XXA Failed or difficult intubation, initial encounter: Secondary | ICD-10-CM

## 2022-07-13 DIAGNOSIS — Z79899 Other long term (current) drug therapy: Secondary | ICD-10-CM | POA: Insufficient documentation

## 2022-07-13 DIAGNOSIS — Z880 Allergy status to penicillin: Secondary | ICD-10-CM

## 2022-07-13 LAB — COMPREHENSIVE METABOLIC PANEL
ALT: 20 U/L (ref 0–44)
AST: 17 U/L (ref 15–41)
Albumin: 4.1 g/dL (ref 3.5–5.0)
Alkaline Phosphatase: 113 U/L (ref 38–126)
Anion gap: 8 (ref 5–15)
BUN: 14 mg/dL (ref 6–20)
CO2: 23 mmol/L (ref 22–32)
Calcium: 8.9 mg/dL (ref 8.9–10.3)
Chloride: 108 mmol/L (ref 98–111)
Creatinine, Ser: 1.16 mg/dL (ref 0.61–1.24)
GFR, Estimated: >60 mL/min (ref >60)
Glucose, Bld: 120 mg/dL — ABNORMAL HIGH (ref 70–99)
Potassium: 3.8 mmol/L (ref 3.5–5.1)
Sodium: 139 mmol/L (ref 135–145)
Total Bilirubin: 1 mg/dL (ref 0.3–1.2)
Total Protein: 8.1 g/dL (ref 6.5–8.1)

## 2022-07-13 LAB — CBC WITH DIFFERENTIAL/PLATELET
Abs Immature Granulocytes: 0.04 10*3/uL (ref 0.00–0.07)
Basophils Absolute: 0.1 10*3/uL (ref 0.0–0.1)
Basophils Relative: 0 %
Eosinophils Absolute: 0.1 10*3/uL (ref 0.0–0.5)
Eosinophils Relative: 1 %
HCT: 47 % (ref 39.0–52.0)
Hemoglobin: 16.2 g/dL (ref 13.0–17.0)
Immature Granulocytes: 0 %
Lymphocytes Relative: 16 %
Lymphs Abs: 1.9 10*3/uL (ref 0.7–4.0)
MCH: 30.3 pg (ref 26.0–34.0)
MCHC: 34.5 g/dL (ref 30.0–36.0)
MCV: 87.9 fL (ref 80.0–100.0)
Monocytes Absolute: 1.5 10*3/uL — ABNORMAL HIGH (ref 0.1–1.0)
Monocytes Relative: 13 %
Neutro Abs: 8.5 10*3/uL — ABNORMAL HIGH (ref 1.7–7.7)
Neutrophils Relative %: 70 %
Platelets: 307 10*3/uL (ref 150–400)
RBC: 5.35 MIL/uL (ref 4.22–5.81)
RDW: 11.9 % (ref 11.5–15.5)
WBC: 12.1 10*3/uL — ABNORMAL HIGH (ref 4.0–10.5)
nRBC: 0 % (ref 0.0–0.2)

## 2022-07-13 LAB — URINALYSIS, ROUTINE W REFLEX MICROSCOPIC
Bacteria, UA: NONE SEEN
Glucose, UA: NEGATIVE mg/dL
Hgb urine dipstick: NEGATIVE
Ketones, ur: 5 mg/dL — AB
Leukocytes,Ua: NEGATIVE
Nitrite: NEGATIVE
Protein, ur: 100 mg/dL — AB
Specific Gravity, Urine: >1.046 — ABNORMAL HIGH (ref 1.005–1.030)
pH: 5 (ref 5.0–8.0)

## 2022-07-13 LAB — LIPASE, BLOOD: Lipase: 27 U/L (ref 11–51)

## 2022-07-13 MED ORDER — IOHEXOL 300 MG/ML  SOLN
100.0000 mL | Freq: Once | INTRAMUSCULAR | Status: AC | PRN
  Administered 2022-07-13: 100 mL via INTRAVENOUS

## 2022-07-13 MED ORDER — LIDOCAINE-PRILOCAINE 2.5-2.5 % EX CREA: 1.0000 | TOPICAL_CREAM | CUTANEOUS | 0 refills | Status: DC | PRN

## 2022-07-13 MED ORDER — HYDROCORTISONE (PERIANAL) 2.5 % EX CREA: 1.0000 | TOPICAL_CREAM | Freq: Two times a day (BID) | CUTANEOUS | 0 refills | Status: DC | PRN

## 2022-07-13 NOTE — Discharge Instructions (Addendum)
It was a pleasure caring for you today in the emergency department. ° °Please return to the emergency department for any worsening or worrisome symptoms. ° ° °

## 2022-07-13 NOTE — ED Triage Notes (Signed)
Reports rectal pain x 21 days. Says he has a hx of perianal abscess and request ct to r/o.

## 2022-07-13 NOTE — ED Provider Triage Note (Signed)
Emergency Medicine Provider Triage Evaluation Note  Danny Camacho , a 31 y.o. male  was evaluated in triage.  Pt complains of ongoing rectal pain.  He reports that he has a history of perirectal abscesses.  He was sen this morning and his pain is worse.  He denies fevers.  Reports compliance with meds.   Symptoms have been ongoing for three weeks, now with N/V, cold chills.    Physical Exam  BP (!) 144/99 (BP Location: Left Arm)   Pulse (!) 115   Temp 99.1 F (37.3 C) (Oral)   Resp 18   Ht 5\' 10"  (1.778 m)   Wt 108.9 kg   SpO2 100%   BMI 34.44 kg/m  Gen:   Awake, no distress   Resp:  Normal effort  MSK:   Moves extremities without difficulty  Other:  GU exam deferred   Medical Decision Making  Medically screening exam initiated at 8:44 PM.  Appropriate orders placed.  Danny Camacho was informed that the remainder of the evaluation will be completed by another provider, this initial triage assessment does not replace that evaluation, and the importance of remaining in the ED until their evaluation is complete.  Will obtain CT abdomen pelvis with contrast.  Decision made for Abdomen inclusion with N/V. May be a side effect of tramadol vs gastritis vs constipation from narcotics and holding Bms.     Danny Camacho, Cristina Gong 07/13/22 2049

## 2022-07-13 NOTE — ED Provider Notes (Signed)
[facility]-EMERGENCY DEPT Provider Note   CSN: [id number] Arrival date & time: 07/13/22  8768     History  Chief Complaint  Patient presents with   Rectal Pain    Danny Camacho is a 31 y.o. male.  Patient as above with significant medical history as below, including HIV, HTN, perirectal abscess who presents to the ED with complaint of rectal pain.  Symptoms ongoing for approximately 1.5 weeks, pain localized to his rectum.  Worse with defecation.  He was seen at the [place] started on suppository and stool softener.  He is also given analgesics.  He has been taking sitz bath's.  He is having some discomfort with wiping with defecation.  Scant blood on toilet paper after wiping.  He is tolerant p.o. intake without difficulty but is trying to limit his p.o. intake because he is worried is going to hurt when he has a bowel movement.  No chest pain, dyspnea, fevers, chills, nausea or vomiting.  No change urination.     Past Medical History:  Diagnosis Date   HIV (human immunodeficiency virus infection) (HCC)    Hypertension     Past Surgical History:  Procedure Laterality Date   FRACTURE SURGERY     left leg fx w/ rod placement   INCISION AND DRAINAGE PERIRECTAL ABSCESS N/A 03/29/2017   Procedure: EXAM UNDER ANESTHESIA IRRIGATION AND DEBRIDEMENT RECTAL ABSCESS;  Surgeon: Chevis Pretty III, MD;  Location: [facility];  Service: General;  Laterality: N/A;   TONSILLECTOMY       The history is provided by the patient. No language interpreter was used.       Home Medications Prior to Admission medications   Medication Sig Start Date End Date Taking? Authorizing Provider  hydrocortisone (ANUSOL-HC) 2.5 % rectal cream Place 1 Application rectally 2 (two) times daily as needed for hemorrhoids or anal itching. 07/13/22  Yes Tanda Rockers A, DO  lidocaine-prilocaine (EMLA) cream Apply 1 Application topically as needed. 07/13/22  Yes Tanda Rockers A, DO  dolutegravir (TIVICAY) 50 MG  tablet Take 50 mg by mouth daily.    [provider]  emtricitabine-tenofovir AF (DESCOVY) 200-25 MG tablet Take 1 tablet by mouth daily.    [provider]  escitalopram (LEXAPRO) 10 MG tablet Take 10 mg by mouth daily. 06/10/20   [provider]  ibuprofen (ADVIL,MOTRIN) 200 MG tablet Take 400 mg by mouth every 6 (six) hours as needed for fever or headache.    [provider]  lisinopril (PRINIVIL,ZESTRIL) 20 MG tablet Take 1 tablet (20 mg total) by mouth daily. Patient taking differently: Take 10 mg by mouth daily.  11/29/15   Mady Gemma, PA-C  pantoprazole (PROTONIX) 20 MG tablet Take 1 tablet (20 mg total) by mouth daily for 15 days. 10/26/20 11/10/20  Tanda Rockers, PA-C      Allergies    Penicillins    Review of Systems   Review of Systems  Constitutional:  Negative for chills and fever.  HENT:  Negative for facial swelling and trouble swallowing.   Eyes:  Negative for photophobia and visual disturbance.  Respiratory:  Negative for cough and shortness of breath.   Cardiovascular:  Negative for chest pain and palpitations.  Gastrointestinal:  Positive for anal bleeding and rectal pain. Negative for abdominal pain, nausea and vomiting.  Endocrine: Negative for polydipsia and polyuria.  Genitourinary:  Negative for difficulty urinating and hematuria.  Musculoskeletal:  Negative for gait problem and joint swelling.  Skin:  Negative for pallor and rash.  Neurological:  Negative for syncope and headaches.  Psychiatric/Behavioral:  Negative for agitation and confusion.     Physical Exam Updated Vital Signs BP 128/87   Pulse 93   Temp 97.7 F (36.5 C) (Oral)   Resp 18   Ht 5\' 10"  (1.778 m)   Wt 108.9 kg   SpO2 98%   BMI 34.44 kg/m  Physical Exam Vitals and nursing note reviewed. Exam conducted with a chaperone present.  Constitutional:      General: He is not in acute distress.    Appearance: He is well-developed. He is obese.  He is not ill-appearing or diaphoretic.  HENT:     Head: Normocephalic and atraumatic.     Right Ear: External ear normal.     Left Ear: External ear normal.     Mouth/Throat:     Mouth: Mucous membranes are moist.  Eyes:     General: No scleral icterus. Cardiovascular:     Rate and Rhythm: Normal rate and regular rhythm.     Pulses: Normal pulses.     Heart sounds: Normal heart sounds.  Pulmonary:     Effort: Pulmonary effort is normal. No respiratory distress.     Breath sounds: Normal breath sounds.  Abdominal:     General: Abdomen is flat.     Palpations: Abdomen is soft.     Tenderness: There is no abdominal tenderness.  Genitourinary:      Comments: External nonthrombosed hemorrhoid No frank bleeding Brown stool in rectal vault Musculoskeletal:        General: Normal range of motion.     Cervical back: Normal range of motion.     Right lower leg: No edema.     Left lower leg: No edema.  Skin:    General: Skin is warm and dry.     Capillary Refill: Capillary refill takes less than 2 seconds.  Neurological:     Mental Status: He is alert and oriented to person, place, and time.  Psychiatric:        Mood and Affect: Mood normal.        Behavior: Behavior normal.     ED Results / Procedures / Treatments   Labs (all labs ordered are listed, but only abnormal results are displayed) Labs Reviewed - No data to display  EKG None  Radiology No results found.  Procedures Procedures    Medications Ordered in ED Medications - No data to display  ED Course/ Medical Decision Making/ A&P                           Medical Decision Making Risk Prescription drug management.    CC: Rectal pain  This patient presents to the Emergency Department for the above complaint. This involves an extensive number of treatment options and is a complaint that carries with it a high risk of complications and morbidity. Vital signs were reviewed. Serious etiologies  considered.  Record review:  Previous records obtained and reviewed prior ED visits, prior labs and imaging  Additional history obtained from N/A  Medical and surgical history as noted above.   Work up as above, notable for:  Labs & imaging results that were available during my care of the patient were visualized by me and considered in my medical decision making.  Physical exam as above.    Management: Patient on Emla and Anusol  ED Course:  Reassessment:  Symptoms unchanged  Admission was considered.    Patient with external hemorrhoid, this is likely source of his discomfort.  No evidence of thrombosed hemorrhoid.  No frank bleeding.  No evidence of abscess or local tissue infection.  Patient was also given general surgery follow-up in regards to his external hemorrhoid.  Discussed supportive care at home, sitz bath's, dietary changes, strict return precautions were discussed  The patient improved significantly and was discharged in stable condition. Detailed discussions were had with the patient regarding current findings, and need for close f/u with PCP or on call doctor. The patient has been instructed to return immediately if the symptoms worsen in any way for re-evaluation. Patient verbalized understanding and is in agreement with current care plan. All questions answered prior to discharge.            Social determinants of health include -  Social History   Socioeconomic History   Marital status: Single    Spouse name: Not on file   Number of children: Not on file   Years of education: Not on file   Highest education level: Not on file  Occupational History   Not on file  Tobacco Use   Smoking status: Never   Smokeless tobacco: Never  Vaping Use   Vaping Use: Never used  Substance and Sexual Activity   Alcohol use: Not Currently   Drug use: Yes    Types: Marijuana   Sexual activity: Yes    Birth control/protection: None  Other Topics Concern    Not on file  Social History Narrative   Not on file   Social Determinants of Health   Financial Resource Strain: Not on file  Food Insecurity: Not on file  Transportation Needs: Not on file  Physical Activity: Not on file  Stress: Not on file  Social Connections: Not on file  Intimate Partner Violence: Not on file      This chart was dictated using voice recognition software.  Despite best efforts to proofread,  errors can occur which can change the documentation meaning.         Final Clinical Impression(s) / ED Diagnoses Final diagnoses:  Rectal pain  External hemorrhoid    Rx / DC Orders ED Discharge Orders          Ordered    hydrocortisone (ANUSOL-HC) 2.5 % rectal cream  2 times daily PRN        07/13/22 0733    lidocaine-prilocaine (EMLA) cream  As needed        07/13/22 Marie, [place], DO 07/13/22 [phone]

## 2022-07-13 NOTE — ED Notes (Signed)
Lab draw unsuccessful 

## 2022-07-13 NOTE — ED Triage Notes (Signed)
Patient arrived with complaints of rectal pain over the last 12 days. Given suppository's at [facility] and told if symptoms persist come to ED. Reporting some spots of blood when wiping.

## 2022-07-14 ENCOUNTER — Observation Stay (HOSPITAL_COMMUNITY): Payer: No Typology Code available for payment source | Admitting: Certified Registered"

## 2022-07-14 ENCOUNTER — Encounter (HOSPITAL_COMMUNITY): Payer: Self-pay

## 2022-07-14 ENCOUNTER — Encounter (HOSPITAL_COMMUNITY): Admission: EM | Disposition: A | Discharge status: Home / Self Care | Payer: Self-pay | Source: Home / Self Care

## 2022-07-14 ENCOUNTER — Observation Stay (HOSPITAL_BASED_OUTPATIENT_CLINIC_OR_DEPARTMENT_OTHER): Payer: No Typology Code available for payment source | Admitting: Certified Registered"

## 2022-07-14 ENCOUNTER — Other Ambulatory Visit: Payer: Self-pay

## 2022-07-14 DIAGNOSIS — K611 Rectal abscess: Secondary | ICD-10-CM | POA: Diagnosis not present

## 2022-07-14 HISTORY — PX: INCISION AND DRAINAGE PERIRECTAL ABSCESS: SHX1804

## 2022-07-14 SURGERY — INCISION AND DRAINAGE, ABSCESS, PERIRECTAL
Anesthesia: General

## 2022-07-14 MED ORDER — ONDANSETRON HCL 4 MG/2ML IJ SOLN
INTRAMUSCULAR | Status: AC
  Filled 2022-07-14: qty 2

## 2022-07-14 MED ORDER — DEXAMETHASONE SODIUM PHOSPHATE 10 MG/ML IJ SOLN
INTRAMUSCULAR | Status: DC | PRN
  Administered 2022-07-14: 10 mg via INTRAVENOUS

## 2022-07-14 MED ORDER — ORAL CARE MOUTH RINSE: 15.0000 mL | Freq: Once | OROMUCOSAL | Status: AC

## 2022-07-14 MED ORDER — SODIUM CHLORIDE 0.9 % IV SOLN
2.0000 g | INTRAVENOUS | Status: DC
  Administered 2022-07-14 – 2022-07-16 (×3): 2 g via INTRAVENOUS
  Filled 2022-07-14 (×3): qty 20

## 2022-07-14 MED ORDER — PROPOFOL 10 MG/ML IV BOLUS
INTRAVENOUS | Status: AC
  Filled 2022-07-14: qty 20

## 2022-07-14 MED ORDER — HYDROMORPHONE HCL 1 MG/ML IJ SOLN
1.0000 mg | Freq: Once | INTRAMUSCULAR | Status: AC
  Administered 2022-07-14: 1 mg via INTRAVENOUS
  Filled 2022-07-14: qty 1

## 2022-07-14 MED ORDER — PROPOFOL 10 MG/ML IV BOLUS
INTRAVENOUS | Status: DC | PRN
  Administered 2022-07-14 (×2): 200 mg via INTRAVENOUS

## 2022-07-14 MED ORDER — LISINOPRIL 10 MG PO TABS
10.0000 mg | ORAL_TABLET | Freq: Every day | ORAL | Status: DC
  Filled 2022-07-14: qty 1

## 2022-07-14 MED ORDER — KETOROLAC TROMETHAMINE 30 MG/ML IJ SOLN: 30.0000 mg | Freq: Once | INTRAMUSCULAR | Status: DC | PRN

## 2022-07-14 MED ORDER — MIDAZOLAM HCL 2 MG/2ML IJ SOLN
INTRAMUSCULAR | Status: DC | PRN
  Administered 2022-07-14: 2 mg via INTRAVENOUS

## 2022-07-14 MED ORDER — EMTRICITABINE-TENOFOVIR AF 200-25 MG PO TABS
1.0000 | ORAL_TABLET | Freq: Every day | ORAL | Status: DC
  Administered 2022-07-15 – 2022-07-16 (×2): 1 via ORAL
  Filled 2022-07-14 (×3): qty 1

## 2022-07-14 MED ORDER — BUPIVACAINE LIPOSOME 1.3 % IJ SUSP
INTRAMUSCULAR | Status: DC | PRN
  Administered 2022-07-14: 20 mL

## 2022-07-14 MED ORDER — BUPIVACAINE-EPINEPHRINE 0.25% -1:200000 IJ SOLN
INTRAMUSCULAR | Status: DC | PRN
  Administered 2022-07-14: 30 mL

## 2022-07-14 MED ORDER — FENTANYL CITRATE (PF) 250 MCG/5ML IJ SOLN
INTRAMUSCULAR | Status: DC | PRN
  Administered 2022-07-14 (×4): 50 ug via INTRAVENOUS

## 2022-07-14 MED ORDER — ONDANSETRON 4 MG PO TBDP
4.0000 mg | ORAL_TABLET | Freq: Four times a day (QID) | ORAL | Status: DC | PRN
Start: 2022-07-14 — End: 2022-07-16

## 2022-07-14 MED ORDER — FENTANYL CITRATE (PF) 100 MCG/2ML IJ SOLN
INTRAMUSCULAR | Status: AC
  Filled 2022-07-14: qty 2

## 2022-07-14 MED ORDER — OXYCODONE HCL 5 MG PO TABS
ORAL_TABLET | ORAL | Status: AC
  Filled 2022-07-14: qty 1

## 2022-07-14 MED ORDER — FENTANYL CITRATE PF 50 MCG/ML IJ SOSY
25.0000 ug | PREFILLED_SYRINGE | INTRAMUSCULAR | Status: DC | PRN
  Administered 2022-07-14: 50 ug via INTRAVENOUS

## 2022-07-14 MED ORDER — ONDANSETRON HCL 4 MG/2ML IJ SOLN
INTRAMUSCULAR | Status: DC | PRN
  Administered 2022-07-14: 4 mg via INTRAVENOUS

## 2022-07-14 MED ORDER — ONDANSETRON HCL 4 MG/2ML IJ SOLN
4.0000 mg | Freq: Four times a day (QID) | INTRAMUSCULAR | Status: DC | PRN
Start: 2022-07-14 — End: 2022-07-15

## 2022-07-14 MED ORDER — DEXAMETHASONE SODIUM PHOSPHATE 10 MG/ML IJ SOLN
INTRAMUSCULAR | Status: AC
  Filled 2022-07-14: qty 1

## 2022-07-14 MED ORDER — ENOXAPARIN SODIUM 40 MG/0.4ML IJ SOSY
40.0000 mg | PREFILLED_SYRINGE | INTRAMUSCULAR | Status: DC
  Administered 2022-07-15 – 2022-07-16 (×2): 40 mg via SUBCUTANEOUS
  Filled 2022-07-14 (×2): qty 0.4

## 2022-07-14 MED ORDER — DOCUSATE SODIUM 100 MG PO CAPS
100.0000 mg | ORAL_CAPSULE | Freq: Two times a day (BID) | ORAL | Status: DC
  Administered 2022-07-14 – 2022-07-15 (×3): 100 mg via ORAL
  Filled 2022-07-14 (×3): qty 1

## 2022-07-14 MED ORDER — FENTANYL CITRATE PF 50 MCG/ML IJ SOSY
PREFILLED_SYRINGE | INTRAMUSCULAR | Status: AC
  Filled 2022-07-14: qty 2

## 2022-07-14 MED ORDER — PROMETHAZINE HCL 25 MG/ML IJ SOLN: 6.2500 mg | INTRAMUSCULAR | Status: DC | PRN

## 2022-07-14 MED ORDER — DIPHENHYDRAMINE HCL 25 MG PO CAPS
25.0000 mg | ORAL_CAPSULE | Freq: Four times a day (QID) | ORAL | Status: DC | PRN
Start: 2022-07-14 — End: 2022-07-16

## 2022-07-14 MED ORDER — SODIUM CHLORIDE 0.9 % IV BOLUS
1000.0000 mL | Freq: Once | INTRAVENOUS | Status: AC
  Administered 2022-07-14: 1000 mL via INTRAVENOUS

## 2022-07-14 MED ORDER — OXYCODONE HCL 5 MG PO TABS
5.0000 mg | ORAL_TABLET | ORAL | Status: DC | PRN
  Administered 2022-07-14 – 2022-07-15 (×2): 5 mg via ORAL
  Filled 2022-07-14 (×2): qty 1

## 2022-07-14 MED ORDER — DOLUTEGRAVIR SODIUM 50 MG PO TABS
50.0000 mg | ORAL_TABLET | Freq: Every day | ORAL | Status: DC
  Administered 2022-07-15 – 2022-07-16 (×2): 50 mg via ORAL
  Filled 2022-07-14 (×3): qty 1

## 2022-07-14 MED ORDER — SODIUM CHLORIDE 0.9 % IV SOLN: INTRAVENOUS | Status: DC

## 2022-07-14 MED ORDER — IBUPROFEN 400 MG PO TABS: 400.0000 mg | ORAL_TABLET | Freq: Four times a day (QID) | ORAL | Status: DC | PRN

## 2022-07-14 MED ORDER — BUPIVACAINE-EPINEPHRINE (PF) 0.25% -1:200000 IJ SOLN
INTRAMUSCULAR | Status: AC
Start: 2022-07-14 — End: ?
  Filled 2022-07-14: qty 30

## 2022-07-14 MED ORDER — MIDAZOLAM HCL 2 MG/2ML IJ SOLN
INTRAMUSCULAR | Status: AC
  Filled 2022-07-14: qty 2

## 2022-07-14 MED ORDER — DIPHENHYDRAMINE HCL 50 MG/ML IJ SOLN: 25.0000 mg | Freq: Four times a day (QID) | INTRAMUSCULAR | Status: DC | PRN

## 2022-07-14 MED ORDER — HYDROMORPHONE HCL 1 MG/ML IJ SOLN
0.5000 mg | INTRAMUSCULAR | Status: DC | PRN
  Administered 2022-07-14 – 2022-07-15 (×5): 0.5 mg via INTRAVENOUS
  Filled 2022-07-14 (×5): qty 0.5

## 2022-07-14 MED ORDER — BUPIVACAINE LIPOSOME 1.3 % IJ SUSP
INTRAMUSCULAR | Status: AC
  Filled 2022-07-14: qty 20

## 2022-07-14 MED ORDER — ACETAMINOPHEN 500 MG PO TABS
1000.0000 mg | ORAL_TABLET | Freq: Four times a day (QID) | ORAL | Status: DC
  Administered 2022-07-14 – 2022-07-16 (×9): 1000 mg via ORAL
  Filled 2022-07-14 (×10): qty 2

## 2022-07-14 MED ORDER — ALUM & MAG HYDROXIDE-SIMETH 200-200-20 MG/5ML PO SUSP
30.0000 mL | Freq: Four times a day (QID) | ORAL | Status: DC | PRN
  Administered 2022-07-14: 30 mL via ORAL
  Filled 2022-07-14: qty 30

## 2022-07-14 MED ORDER — CHLORHEXIDINE GLUCONATE 0.12 % MT SOLN
15.0000 mL | Freq: Once | OROMUCOSAL | Status: AC
  Administered 2022-07-14: 15 mL via OROMUCOSAL

## 2022-07-14 MED ORDER — LIDOCAINE 2% (20 MG/ML) 5 ML SYRINGE
INTRAMUSCULAR | Status: DC | PRN
  Administered 2022-07-14: 40 mg via INTRAVENOUS
  Administered 2022-07-14: 60 mg via INTRAVENOUS

## 2022-07-14 MED ORDER — OXYCODONE HCL 5 MG PO TABS
5.0000 mg | ORAL_TABLET | Freq: Once | ORAL | Status: AC | PRN
  Administered 2022-07-14: 5 mg via ORAL

## 2022-07-14 MED ORDER — METRONIDAZOLE 500 MG/100ML IV SOLN
500.0000 mg | Freq: Two times a day (BID) | INTRAVENOUS | Status: DC
  Administered 2022-07-14 – 2022-07-16 (×5): 500 mg via INTRAVENOUS
  Filled 2022-07-14 (×5): qty 100

## 2022-07-14 MED ORDER — AMISULPRIDE (ANTIEMETIC) 5 MG/2ML IV SOLN: 10.0000 mg | Freq: Once | INTRAVENOUS | Status: DC | PRN

## 2022-07-14 MED ORDER — OXYCODONE HCL 5 MG/5ML PO SOLN: 5.0000 mg | Freq: Once | ORAL | Status: AC | PRN

## 2022-07-14 MED ORDER — LACTATED RINGERS IV SOLN
INTRAVENOUS | Status: DC
Start: 2022-07-14 — End: 2022-07-14

## 2022-07-14 MED ORDER — SUCCINYLCHOLINE CHLORIDE 200 MG/10ML IV SOSY
PREFILLED_SYRINGE | INTRAVENOUS | Status: DC | PRN
  Administered 2022-07-14: 100 mg via INTRAVENOUS

## 2022-07-14 SURGICAL SUPPLY — 34 items
BAG COUNTER SPONGE SURGICOUNT (BAG) IMPLANT
BLADE SURG 15 STRL LF DISP TIS (BLADE) ×1 IMPLANT
BLADE SURG 15 STRL SS (BLADE) ×1
COVER SURGICAL LIGHT HANDLE (MISCELLANEOUS) ×2 IMPLANT
DRAPE LAPAROTOMY T 102X78X121 (DRAPES) ×2 IMPLANT
DRSG PAD ABDOMINAL 8X10 ST (GAUZE/BANDAGES/DRESSINGS) ×2 IMPLANT
ELECT REM PT RETURN 15FT ADLT (MISCELLANEOUS) ×2 IMPLANT
GAUZE 4X4 16PLY ~~LOC~~+RFID DBL (SPONGE) ×2 IMPLANT
GAUZE PACKING IODOFORM 1/2 (PACKING) ×1 IMPLANT
GAUZE PAD ABD 8X10 STRL (GAUZE/BANDAGES/DRESSINGS) ×1 IMPLANT
GAUZE SPONGE 4X4 12PLY STRL (GAUZE/BANDAGES/DRESSINGS) ×2 IMPLANT
GLOVE ECLIPSE 8.0 STRL XLNG CF (GLOVE) ×2 IMPLANT
GLOVE INDICATOR 8.0 STRL GRN (GLOVE) ×2 IMPLANT
GOWN STRL REUS W/ TWL XL LVL3 (GOWN DISPOSABLE) ×3 IMPLANT
GOWN STRL REUS W/TWL XL LVL3 (GOWN DISPOSABLE) ×3
KIT BASIN OR (CUSTOM PROCEDURE TRAY) ×2 IMPLANT
KIT TURNOVER KIT A (KITS) IMPLANT
NEEDLE HYPO 22GX1.5 SAFETY (NEEDLE) ×2 IMPLANT
PACK BASIC VI WITH GOWN DISP (CUSTOM PROCEDURE TRAY) ×2 IMPLANT
PANTS MESH DISP LRG (UNDERPADS AND DIAPERS) ×2 IMPLANT
PENCIL SMOKE EVACUATOR (MISCELLANEOUS) IMPLANT
SUCTION FRAZIER HANDLE 12FR (TUBING)
SUCTION TUBE FRAZIER 12FR DISP (TUBING) IMPLANT
SURGILUBE 2OZ TUBE FLIPTOP (MISCELLANEOUS) ×2 IMPLANT
SUT CHROMIC 2 0 SH (SUTURE) IMPLANT
SUT CHROMIC 3 0 SH 27 (SUTURE) IMPLANT
SUT VIC AB 2-0 UR6 27 (SUTURE) IMPLANT
SWAB COLLECTION DEVICE MRSA (MISCELLANEOUS) IMPLANT
SWAB CULTURE ESWAB REG 1ML (MISCELLANEOUS) IMPLANT
SYR 20ML LL LF (SYRINGE) ×2 IMPLANT
SYR 3ML LL SCALE MARK (SYRINGE) IMPLANT
SYR BULB IRRIG 60ML STRL (SYRINGE) IMPLANT
TOWEL OR 17X26 10 PK STRL BLUE (TOWEL DISPOSABLE) ×2 IMPLANT
TOWEL OR NON WOVEN STRL DISP B (DISPOSABLE) ×2 IMPLANT

## 2022-07-14 NOTE — Op Note (Signed)
   Patient: Sung Parodi (1991-01-13, [id number])  Date of Surgery: 07/14/2022   Preoperative Diagnosis: Perirectal abscess   Postoperative Diagnosis: Perirectal abscess   Surgical Procedure: IRRIGATION AND DEBRIDEMENT PERIRECTAL ABSCESS:    Operative Team Members:  Surgeon(s) and Role:    * [place] Duquette, Hyman Hopes, MD - Primary   Anesthesiologist: Leonides Grills, MD CRNA: Ezekiel Ina, CRNA; Sindy Guadeloupe, CRNA   Anesthesia: General   Fluids:  Total I/O In: 1140 [I.V.:1140] Out: 50 [Blood:50]  Complications: Please see anesthesia notes, no surgical complications  Drains:  none   Specimen: None  Disposition:   Per anesthesia  Plan of Care:  Per anesthesia    Indications for Procedure: Ociel Retherford is a 31 y.o. male who presented with a perianal abscess.  The procedure itself as well as its risks, benefits and alternatives were discussed.  The risks discussed included but were not limited to the risk of infection, bleeding, damage to nearby structures.  After a full discussion and all questions answered the patient granted consent to proceed.  Findings: Perianal abscess left posterior location   Description of Procedure:   On the date stated above the patient taken the operating room suite and placed in the lithotomy position.  Anesthesia was induced.  The patient's anus was prepped and draped in usual sterile fashion.  Timeout was completed.  Exparel was injected around the field.  Digital rectal exam demonstrated bogginess in the left posterior perianal region.  An incision was made over the previous scar from previous incision and drainage of perianal abscess.  A hemostat was inserted and loculations were broken up.  Purulent drainage was expressed.  Packing was placed in the abscess cavity.  Anesthesia was having difficulty with the airway.  Cultures were not able to be obtained.  The patient was repositioned for optimal treatment of his airway issues.  Please see  anesthesia's notes for more details.  All sponge needle counts were correct at the end of this case.  At the end of the case we reviewed the infection status of the case. Patient: [facility] Patient Case: Urgent Infection Present At Time Of Surgery (PATOS):  perianal abscess  Ivar Drape, MD General, Bariatric, & Minimally Invasive Surgery [facility], [place]

## 2022-07-14 NOTE — Anesthesia Procedure Notes (Signed)
Procedure Name: LMA Insertion Date/Time: 07/14/2022 10:53 AM  Performed by: Sindy Guadeloupe, CRNAPre-anesthesia Checklist: Patient identified, Emergency Drugs available, Suction available, Patient being monitored and Timeout performed Patient Re-evaluated:Patient Re-evaluated prior to induction Oxygen Delivery Method: Circle system utilized Preoxygenation: Pre-oxygenation with 100% oxygen Induction Type: IV induction Ventilation: Mask ventilation without difficulty LMA: LMA inserted LMA Size: 5.0 Tube secured with: Tape Dental Injury: Teeth and Oropharynx as per pre-operative assessment  Comments: LMA placed by Clare Gandy, CRNA.

## 2022-07-14 NOTE — Transfer of Care (Signed)
Immediate Anesthesia Transfer of Care Note  Patient: Danny Camacho  Procedure(s) Performed: IRRIGATION AND DEBRIDEMENT PERIRECTAL ABSCESS  Patient Location: PACU  Anesthesia Type:General  Level of Consciousness: awake, alert  and patient cooperative  Airway & Oxygen Therapy: Patient Spontanous Breathing and Patient connected to face mask oxygen  Post-op Assessment: Report given to RN and Post -op Vital signs reviewed and stable  Post vital signs: Reviewed and stable  Last Vitals:  Vitals Value Taken Time  BP 112/99 07/14/22 1148  Temp    Pulse 107 07/14/22 1151  Resp 19 07/14/22 1151  SpO2 99 % 07/14/22 1151  Vitals shown include unvalidated device data.  Last Pain:  Vitals:   07/14/22 0822  TempSrc:   PainSc: 0-No pain      Patients Stated Pain Goal: 3 (07/14/22 4709)  Complications: No notable events documented.

## 2022-07-14 NOTE — Progress Notes (Signed)
Transition of Care [facility]) Screening Note  Patient Details  Name: Stark Aguinaga Date of Birth: [birth year]  Transition of Care [facility]) CM/SW Contact:    Ewing Schlein, LCSW Phone Number: 07/14/2022, 2:13 PM  Transition of Care Department [facility]) has reviewed patient and no TOC needs have been identified at this time. We will continue to monitor patient advancement through interdisciplinary progression rounds. If new patient transition needs arise, please place a TOC consult.

## 2022-07-14 NOTE — H&P (Signed)
Admitting Physician: Hyman Hopes Gurdeep Keesey  Service: General surgey  CC: rectal pain  Subjective   HPI: Danny Camacho is an 31 y.o. male who is here for severe rectal pain.  Pain started a few days ago.  Pain is very similar to previous perirectal abscess  Past Medical History:  Diagnosis Date   HIV (human immunodeficiency virus infection) (HCC)    Hypertension     Past Surgical History:  Procedure Laterality Date   FRACTURE SURGERY     left leg fx w/ rod placement   INCISION AND DRAINAGE PERIRECTAL ABSCESS N/A 03/29/2017   Procedure: EXAM UNDER ANESTHESIA IRRIGATION AND DEBRIDEMENT RECTAL ABSCESS;  Surgeon: Chevis Pretty III, MD;  Location: [facility];  Service: General;  Laterality: N/A;   TONSILLECTOMY      No family history on file.  Social:  reports that he has never smoked. He has never used smokeless tobacco. He reports that he does not currently use alcohol. He reports current drug use. Drug: Marijuana.  Allergies:  Allergies  Allergen Reactions   Penicillins     Unknown Has patient had a PCN reaction causing immediate rash, facial/tongue/throat swelling, SOB or lightheadedness with hypotension: No Has patient had a PCN reaction causing severe rash involving mucus membranes or skin necrosis: No Has patient had a PCN reaction that required hospitalization No Has patient had a PCN reaction occurring within the last 10 years: No If all of the above answers are "NO", then may proceed with Cephalosporin use.     Medications: Current Outpatient Medications  Medication Instructions   dolutegravir (TIVICAY) 50 mg, Oral, Daily   emtricitabine-tenofovir AF (DESCOVY) 200-25 MG tablet 1 tablet, Oral, Daily   escitalopram (LEXAPRO) 10 mg, Oral, Daily   hydrocortisone (ANUSOL-HC) 2.5 % rectal cream 1 Application, Rectal, 2 times daily PRN   ibuprofen (ADVIL) 400 mg, Every 6 hours PRN   lidocaine-prilocaine (EMLA) cream 1 Application, Topical, As needed   lisinopril (ZESTRIL) 20  mg, Oral, Daily   pantoprazole (PROTONIX) 20 mg, Oral, Daily    ROS - all of the below systems have been reviewed with the patient and positives are indicated with bold text General: chills, fever or night sweats Eyes: blurry vision or double vision ENT: epistaxis or sore throat Allergy/Immunology: itchy/watery eyes or nasal congestion Hematologic/Lymphatic: bleeding problems, blood clots or swollen lymph nodes Endocrine: temperature intolerance or unexpected weight changes Breast: new or changing breast lumps or nipple discharge Resp: cough, shortness of breath, or wheezing CV: chest pain or dyspnea on exertion GI: as per HPI GU: dysuria, trouble voiding, or hematuria MSK: joint pain or joint stiffness Neuro: TIA or stroke symptoms Derm: pruritus and skin lesion changes Psych: anxiety and depression  Objective   PE Blood pressure (!) 134/91, pulse 95, temperature 97.9 F (36.6 C), temperature source Oral, resp. rate 16, height 5\' 10"  (1.778 m), weight 108.9 kg, SpO2 100 %. Constitutional: NAD; conversant; no deformities Eyes: Moist conjunctiva; no lid lag; anicteric; PERRL Neck: Trachea midline; no thyromegaly Lungs: Normal respiratory effort; no tactile fremitus CV: RRR; no palpable thrills; no pitting edema GI: Abd Soft, nontender; no palpable hepatosplenomegaly MSK: Normal range of motion of extremities; no clubbing/cyanosis Psychiatric: Appropriate affect; alert and oriented x3 Lymphatic: No palpable cervical or axillary lymphadenopathy  Results for orders placed or performed during the hospital encounter of 07/13/22 (from the past 24 hour(s))  Urinalysis, Routine w reflex microscopic Urine, Clean Catch     Status: Abnormal   Collection Time: 07/13/22  9:00 PM  Result Value Ref Range   Color, Urine AMBER (A) YELLOW   APPearance CLEAR CLEAR   Specific Gravity, Urine >1.046 (H) 1.005 - 1.030   pH 5.0 5.0 - 8.0   Glucose, UA NEGATIVE NEGATIVE mg/dL   Hgb urine dipstick  NEGATIVE NEGATIVE   Bilirubin Urine SMALL (A) NEGATIVE   Ketones, ur 5 (A) NEGATIVE mg/dL   Protein, ur 703 (A) NEGATIVE mg/dL   Nitrite NEGATIVE NEGATIVE   Leukocytes,Ua NEGATIVE NEGATIVE   RBC / HPF 0-5 0 - 5 RBC/hpf   WBC, UA 0-5 0 - 5 WBC/hpf   Bacteria, UA NONE SEEN NONE SEEN   Squamous Epithelial / LPF 0-5 0 - 5   Mucus PRESENT   Comprehensive metabolic panel     Status: Abnormal   Collection Time: 07/13/22  9:04 PM  Result Value Ref Range   Sodium 139 135 - 145 mmol/L   Potassium 3.8 3.5 - 5.1 mmol/L   Chloride 108 98 - 111 mmol/L   CO2 23 22 - 32 mmol/L   Glucose, Bld 120 (H) 70 - 99 mg/dL   BUN 14 6 - 20 mg/dL   Creatinine, Ser 5.00 0.61 - 1.24 mg/dL   Calcium 8.9 8.9 - 93.8 mg/dL   Total Protein 8.1 6.5 - 8.1 g/dL   Albumin 4.1 3.5 - 5.0 g/dL   AST 17 15 - 41 U/L   ALT 20 0 - 44 U/L   Alkaline Phosphatase 113 38 - 126 U/L   Total Bilirubin 1.0 0.3 - 1.2 mg/dL   GFR, Estimated >18 >29 mL/min   Anion gap 8 5 - 15  Lipase, blood     Status: None   Collection Time: 07/13/22  9:04 PM  Result Value Ref Range   Lipase 27 11 - 51 U/L  CBC with Differential     Status: Abnormal   Collection Time: 07/13/22  9:04 PM  Result Value Ref Range   WBC 12.1 (H) 4.0 - 10.5 K/uL   RBC 5.35 4.22 - 5.81 MIL/uL   Hemoglobin 16.2 13.0 - 17.0 g/dL   HCT 93.7 16.9 - 67.8 %   MCV 87.9 80.0 - 100.0 fL   MCH 30.3 26.0 - 34.0 pg   MCHC 34.5 30.0 - 36.0 g/dL   RDW 93.8 10.1 - 75.1 %   Platelets 307 150 - 400 K/uL   nRBC 0.0 0.0 - 0.2 %   Neutrophils Relative % 70 %   Neutro Abs 8.5 (H) 1.7 - 7.7 K/uL   Lymphocytes Relative 16 %   Lymphs Abs 1.9 0.7 - 4.0 K/uL   Monocytes Relative 13 %   Monocytes Absolute 1.5 (H) 0.1 - 1.0 K/uL   Eosinophils Relative 1 %   Eosinophils Absolute 0.1 0.0 - 0.5 K/uL   Basophils Relative 0 %   Basophils Absolute 0.1 0.0 - 0.1 K/uL   Immature Granulocytes 0 %   Abs Immature Granulocytes 0.04 0.00 - 0.07 K/uL     Imaging Orders         CT ABDOMEN  PELVIS W CONTRAST     1. Rectal abscess measuring up to 2.9 cm at the 5 o'clock position relative to the anus, with mild perirectal inflammatory change. 2. Multiple mildly enlarged perirectal lymph nodes, likely reactive.   Assessment and Plan   Danny Camacho is an 31 y.o. male with a perirectal abscess.  I recommended incision and drainage of perirectal abscess in the OR.  Procedure itself as well  as its risks, benefits and alternatives were discussed and the patient granted consent to proceed.      ICD-10-CM   1. Perirectal abscess  K61.1        Quentin Ore, MD  [facility] Use [email or web address] to contact on call provider

## 2022-07-14 NOTE — ED Provider Notes (Signed)
[facility]Y DEPT [facility] Emergency Department Provider Note MRN:  [id number]  Arrival date & time: 07/14/22     Chief Complaint   Rectal Pain   History of Present Illness   Danny Camacho is a 31 y.o. year-old male with a history of hypertension, HIV presenting to the ED with chief complaint of rectal pain.  Rectal pain for the past 2 or 3 weeks, being treated for hemorrhoids but not working.  Has a history of rectal abscess in the past and wants a CT scan.  Denies fever.  Review of Systems  A thorough review of systems was obtained and all systems are negative except as noted in the HPI and PMH.   Patient's Health History    Past Medical History:  Diagnosis Date   HIV (human immunodeficiency virus infection) (HCC)    Hypertension     Past Surgical History:  Procedure Laterality Date   FRACTURE SURGERY     left leg fx w/ rod placement   INCISION AND DRAINAGE PERIRECTAL ABSCESS N/A 03/29/2017   Procedure: EXAM UNDER ANESTHESIA IRRIGATION AND DEBRIDEMENT RECTAL ABSCESS;  Surgeon: Chevis Pretty III, MD;  Location: [facility];  Service: General;  Laterality: N/A;   TONSILLECTOMY      No family history on file.  Social History   Socioeconomic History   Marital status: Single    Spouse name: Not on file   Number of children: Not on file   Years of education: Not on file   Highest education level: Not on file  Occupational History   Not on file  Tobacco Use   Smoking status: Never   Smokeless tobacco: Never  Vaping Use   Vaping Use: Never used  Substance and Sexual Activity   Alcohol use: Not Currently   Drug use: Yes    Types: Marijuana   Sexual activity: Yes    Birth control/protection: None  Other Topics Concern   Not on file  Social History Narrative   Not on file   Social Determinants of Health   Financial Resource Strain: Not on file  Food Insecurity: Not on file  Transportation Needs: Not on file  Physical Activity: Not on file  Stress: Not on file   Social Connections: Not on file  Intimate Partner Violence: Not on file     Physical Exam   Vitals:   07/14/22 0226 07/14/22 0503  BP: (!) 141/88 (!) 134/91  Pulse: 98 95  Resp: 18 16  Temp: 97.8 F (36.6 C) 97.9 F (36.6 C)  SpO2: 100% 100%    CONSTITUTIONAL: Well-appearing, NAD NEURO/PSYCH:  Alert and oriented x 3, no focal deficits EYES:  eyes equal and reactive ENT/NECK:  no LAD, no JVD CARDIO: Regular rate, well-perfused, normal S1 and S2 PULM:  CTAB no wheezing or rhonchi GI/GU:  non-distended, non-tender MSK/SPINE:  No gross deformities, no edema SKIN:  no rash, atraumatic   *Additional and/or pertinent findings included in MDM below  Diagnostic and Interventional Summary    EKG Interpretation  Date/Time:    Ventricular Rate:    PR Interval:    QRS Duration:   QT Interval:    QTC Calculation:   R Axis:     Text Interpretation:         Labs Reviewed  URINALYSIS, ROUTINE W REFLEX MICROSCOPIC - Abnormal; Notable for the following components:      Result Value   Color, Urine AMBER (*)    Specific Gravity, Urine >1.046 (*)    Bilirubin  Urine SMALL (*)    Ketones, ur 5 (*)    Protein, ur 100 (*)    All other components within normal limits  COMPREHENSIVE METABOLIC PANEL - Abnormal; Notable for the following components:   Glucose, Bld 120 (*)    All other components within normal limits  CBC WITH DIFFERENTIAL/PLATELET - Abnormal; Notable for the following components:   WBC 12.1 (*)    Neutro Abs 8.5 (*)    Monocytes Absolute 1.5 (*)    All other components within normal limits  LIPASE, BLOOD    CT ABDOMEN PELVIS W CONTRAST  Final Result      Medications  enoxaparin (LOVENOX) injection 40 mg (has no administration in time range)  0.9 %  sodium chloride infusion ( Intravenous Stopped 07/14/22 0314)  acetaminophen (TYLENOL) tablet 1,000 mg (1,000 mg Oral Given 07/14/22 0225)  oxyCODONE (Oxy IR/ROXICODONE) immediate release tablet 5 mg (has no  administration in time range)  HYDROmorphone (DILAUDID) injection 0.5 mg (0.5 mg Intravenous Given 07/14/22 0550)  diphenhydrAMINE (BENADRYL) capsule 25 mg (has no administration in time range)    Or  diphenhydrAMINE (BENADRYL) injection 25 mg (has no administration in time range)  docusate sodium (COLACE) capsule 100 mg (100 mg Oral Given 07/14/22 0225)  ondansetron (ZOFRAN-ODT) disintegrating tablet 4 mg (has no administration in time range)    Or  ondansetron (ZOFRAN) injection 4 mg (has no administration in time range)  ibuprofen (ADVIL) tablet 400 mg (has no administration in time range)  lisinopril (ZESTRIL) tablet 10 mg (has no administration in time range)  dolutegravir (TIVICAY) tablet 50 mg (has no administration in time range)  emtricitabine-tenofovir AF (DESCOVY) 200-25 MG per tablet 1 tablet (has no administration in time range)  cefTRIAXone (ROCEPHIN) 2 g in sodium chloride 0.9 % 100 mL IVPB (0 g Intravenous Stopped 07/14/22 0301)  metroNIDAZOLE (FLAGYL) IVPB 500 mg ( Intravenous Infusion Verify 07/14/22 0322)  iohexol (OMNIPAQUE) 300 MG/ML solution 100 mL (100 mLs Intravenous Contrast Given 07/13/22 2213)  HYDROmorphone (DILAUDID) injection 1 mg (1 mg Intravenous Given 07/14/22 0055)  sodium chloride 0.9 % bolus 1,000 mL (1,000 mLs Intravenous New Bag/Given 07/14/22 0054)     Procedures  /  Critical Care .Critical Care  Performed by: Maudie Flakes, MD Authorized by: Maudie Flakes, MD   Critical care provider statement:    Critical care time (minutes):  35   Critical care was necessary to treat or prevent imminent or life-threatening deterioration of the following conditions: Rectal abscess requiring or intervention.   Critical care was time spent personally by me on the following activities:  Development of treatment plan with patient or surrogate, discussions with consultants, evaluation of patient's response to treatment, examination of patient, ordering and review of laboratory  studies, ordering and review of radiographic studies, ordering and performing treatments and interventions, pulse oximetry, re-evaluation of patient's condition and review of old charts   ED Course and Medical Decision Making  Initial Impression and Ddx Continued rectal pain, history of rectal abscess, will obtain CT.  External exam revealing some small nonthrombosed external hemorrhoids.  Past medical/surgical history that increases complexity of ED encounter: History of HIV, rectal abscess  Interpretation of Diagnostics I personally reviewed the laboratory assessment and my interpretation is as follows: Minimal leukocytosis, otherwise no significant blood count or electrolyte disturbance  CT confirms a rectal abscess, 2.5 cm  Patient Reassessment and Ultimate Disposition/Management     General surgery was consulted and will manage patient in the OR.  Providing antibiotics.  Patient management required discussion with the following services or consulting groups:  General/Trauma Surgery  Complexity of Problems Addressed Acute illness or injury that poses threat of life of bodily function  Additional Data Reviewed and Analyzed Further history obtained from: None  Additional Factors Impacting ED Encounter Risk Consideration of hospitalization  Elmer Sow. Pilar Plate, MD [facility] Emergency Medicine [facility] [email or web address]  Final Clinical Impressions(s) / ED Diagnoses     ICD-10-CM   1. Perirectal abscess  K61.1       ED Discharge Orders     None        Discharge Instructions Discussed with and Provided to Patient:   Discharge Instructions   None      Sabas Sous, MD 07/14/22 [phone]

## 2022-07-14 NOTE — Anesthesia Postprocedure Evaluation (Signed)
Anesthesia Post Note  Patient: Danny Camacho  Procedure(s) Performed: IRRIGATION AND DEBRIDEMENT PERIRECTAL ABSCESS     Patient location during evaluation: PACU Anesthesia Type: General Level of consciousness: awake and alert Pain management: pain level controlled Vital Signs Assessment: post-procedure vital signs reviewed and stable Respiratory status: spontaneous breathing, nonlabored ventilation, respiratory function stable and patient connected to nasal cannula oxygen Cardiovascular status: blood pressure returned to baseline and stable Postop Assessment: no apparent nausea or vomiting Anesthetic complications: yes (Introperative aspiration and laryngospasm ) Comments: Spoke to patient and mother Joni Reining) regarding intraoperative anesthetic events. Recommended avoidance of general anesthesia due to airway reactivity. If general anesthesia necessary, would consider GETA. All questions answered. Surgeon updated.    No notable events documented.  Last Vitals:  Vitals:   07/14/22 1231 07/14/22 1342  BP: 139/85 (!) 133/90  Pulse: (!) 104 89  Resp: 16 18  Temp: 36.7 C 37 C  SpO2: 100% 100%    Last Pain:  Vitals:   07/14/22 1342  TempSrc: Oral  PainSc:                  Emiel Kielty P Arnold Depinto

## 2022-07-14 NOTE — Anesthesia Preprocedure Evaluation (Addendum)
Anesthesia Evaluation  Patient identified by MRN, date of birth, ID band Patient awake    Reviewed: Allergy & Precautions, NPO status , Patient's Chart, lab work & pertinent test results  Airway Mallampati: II  TM Distance: >3 FB Neck ROM: Full    Dental no notable dental hx.    Pulmonary neg pulmonary ROS,    Pulmonary exam normal        Cardiovascular hypertension, Pt. on medications Normal cardiovascular exam     Neuro/Psych negative neurological ROS  negative psych ROS   GI/Hepatic negative GI ROS, Neg liver ROS,   Endo/Other  negative endocrine ROS  Renal/GU negative Renal ROS     Musculoskeletal negative musculoskeletal ROS (+)   Abdominal (+) + obese,   Peds  Hematology  (+) HIV,   Anesthesia Other Findings Perirectal abscess  Reproductive/Obstetrics                            Anesthesia Physical Anesthesia Plan  ASA: 2  Anesthesia Plan: General   Post-op Pain Management:    Induction: Intravenous  PONV Risk Score and Plan: 2 and Ondansetron, Dexamethasone, Midazolam and Treatment may vary due to age or medical condition  Airway Management Planned:   Additional Equipment:   Intra-op Plan:   Post-operative Plan: Extubation in OR  Informed Consent: I have reviewed the patients History and Physical, chart, labs and discussed the procedure including the risks, benefits and alternatives for the proposed anesthesia with the patient or authorized representative who has indicated his/her understanding and acceptance.     Dental advisory given  Plan Discussed with: CRNA  Anesthesia Plan Comments:        Anesthesia Quick Evaluation

## 2022-07-15 ENCOUNTER — Encounter (HOSPITAL_COMMUNITY): Payer: Self-pay | Admitting: Surgery

## 2022-07-15 ENCOUNTER — Observation Stay (HOSPITAL_COMMUNITY): Payer: No Typology Code available for payment source

## 2022-07-15 DIAGNOSIS — K612 Anorectal abscess: Secondary | ICD-10-CM | POA: Diagnosis present

## 2022-07-15 DIAGNOSIS — Z21 Asymptomatic human immunodeficiency virus [HIV] infection status: Secondary | ICD-10-CM | POA: Diagnosis present

## 2022-07-15 DIAGNOSIS — K644 Residual hemorrhoidal skin tags: Secondary | ICD-10-CM | POA: Diagnosis present

## 2022-07-15 DIAGNOSIS — T884XXA Failed or difficult intubation, initial encounter: Secondary | ICD-10-CM

## 2022-07-15 DIAGNOSIS — Z880 Allergy status to penicillin: Secondary | ICD-10-CM | POA: Diagnosis not present

## 2022-07-15 DIAGNOSIS — I10 Essential (primary) hypertension: Secondary | ICD-10-CM | POA: Diagnosis present

## 2022-07-15 MED ORDER — SODIUM CHLORIDE 0.9 % IV SOLN: 250.0000 mL | INTRAVENOUS | Status: DC | PRN

## 2022-07-15 MED ORDER — PROCHLORPERAZINE EDISYLATE 10 MG/2ML IJ SOLN: 5.0000 mg | INTRAMUSCULAR | Status: DC | PRN

## 2022-07-15 MED ORDER — SODIUM CHLORIDE 0.9% FLUSH: 3.0000 mL | INTRAVENOUS | Status: DC | PRN

## 2022-07-15 MED ORDER — GABAPENTIN 100 MG PO CAPS
200.0000 mg | ORAL_CAPSULE | Freq: Three times a day (TID) | ORAL | Status: DC
  Administered 2022-07-15 – 2022-07-16 (×4): 200 mg via ORAL
  Filled 2022-07-15 (×4): qty 2

## 2022-07-15 MED ORDER — DIPHENHYDRAMINE HCL 50 MG/ML IJ SOLN: 12.5000 mg | Freq: Four times a day (QID) | INTRAMUSCULAR | Status: DC | PRN

## 2022-07-15 MED ORDER — MENTHOL 3 MG MT LOZG: 1.0000 | LOZENGE | OROMUCOSAL | Status: DC | PRN

## 2022-07-15 MED ORDER — SODIUM CHLORIDE 0.9% FLUSH
3.0000 mL | Freq: Two times a day (BID) | INTRAVENOUS | Status: DC
  Administered 2022-07-15 – 2022-07-16 (×3): 3 mL via INTRAVENOUS

## 2022-07-15 MED ORDER — PHENOL 1.4 % MT LIQD: 2.0000 | OROMUCOSAL | Status: DC | PRN

## 2022-07-15 MED ORDER — METOPROLOL TARTRATE 5 MG/5ML IV SOLN: 5.0000 mg | Freq: Four times a day (QID) | INTRAVENOUS | Status: DC | PRN

## 2022-07-15 MED ORDER — LACTATED RINGERS IV BOLUS: 1000.0000 mL | Freq: Three times a day (TID) | INTRAVENOUS | Status: DC | PRN

## 2022-07-15 MED ORDER — IBUPROFEN 400 MG PO TABS
400.0000 mg | ORAL_TABLET | Freq: Four times a day (QID) | ORAL | Status: DC | PRN
  Administered 2022-07-16: 800 mg via ORAL
  Filled 2022-07-15: qty 2

## 2022-07-15 MED ORDER — POLYETHYLENE GLYCOL 3350 17 G PO PACK
17.0000 g | PACK | Freq: Two times a day (BID) | ORAL | Status: DC | PRN
  Administered 2022-07-15: 17 g via ORAL

## 2022-07-15 MED ORDER — OXYCODONE HCL 5 MG PO TABS
5.0000 mg | ORAL_TABLET | ORAL | Status: DC | PRN
  Administered 2022-07-15 – 2022-07-16 (×2): 10 mg via ORAL
  Administered 2022-07-16: 5 mg via ORAL
  Filled 2022-07-15 (×2): qty 1
  Filled 2022-07-15 (×2): qty 2

## 2022-07-15 MED ORDER — LIP MEDEX EX OINT
TOPICAL_OINTMENT | Freq: Two times a day (BID) | CUTANEOUS | Status: DC
  Administered 2022-07-16: 1 via TOPICAL
  Filled 2022-07-15: qty 7

## 2022-07-15 MED ORDER — ESCITALOPRAM OXALATE 20 MG PO TABS
10.0000 mg | ORAL_TABLET | Freq: Every day | ORAL | Status: DC
Start: 2022-07-15 — End: 2022-07-16
  Administered 2022-07-15 – 2022-07-16 (×2): 10 mg via ORAL
  Filled 2022-07-15 (×2): qty 1

## 2022-07-15 MED ORDER — ENSURE SURGERY PO LIQD
237.0000 mL | Freq: Two times a day (BID) | ORAL | Status: DC
  Administered 2022-07-15: 237 mL via ORAL

## 2022-07-15 MED ORDER — POLYETHYLENE GLYCOL 3350 17 G PO PACK
17.0000 g | PACK | Freq: Two times a day (BID) | ORAL | Status: DC
  Administered 2022-07-15 (×2): 17 g via ORAL
  Filled 2022-07-15 (×3): qty 1

## 2022-07-15 MED ORDER — HYDROMORPHONE HCL 1 MG/ML IJ SOLN
0.5000 mg | INTRAMUSCULAR | Status: DC | PRN
  Administered 2022-07-15 – 2022-07-16 (×2): 1 mg via INTRAVENOUS
  Filled 2022-07-15 (×2): qty 1

## 2022-07-15 MED ORDER — METHOCARBAMOL 1000 MG/10ML IJ SOLN: 1000.0000 mg | Freq: Four times a day (QID) | INTRAVENOUS | Status: DC | PRN

## 2022-07-15 MED ORDER — METHOCARBAMOL 500 MG PO TABS
1000.0000 mg | ORAL_TABLET | Freq: Four times a day (QID) | ORAL | Status: DC | PRN
  Administered 2022-07-15 – 2022-07-16 (×3): 1000 mg via ORAL
  Filled 2022-07-15 (×3): qty 2

## 2022-07-15 MED ORDER — SODIUM CHLORIDE 0.9 % IV SOLN: 8.0000 mg | Freq: Four times a day (QID) | INTRAVENOUS | Status: DC | PRN

## 2022-07-15 MED ORDER — ONDANSETRON HCL 4 MG/2ML IJ SOLN
4.0000 mg | Freq: Four times a day (QID) | INTRAMUSCULAR | Status: DC | PRN
  Administered 2022-07-15: 4 mg via INTRAVENOUS
  Filled 2022-07-15: qty 2

## 2022-07-15 MED ORDER — MAGIC MOUTHWASH: 15.0000 mL | Freq: Four times a day (QID) | ORAL | Status: DC | PRN

## 2022-07-15 MED ORDER — PANTOPRAZOLE SODIUM 40 MG PO TBEC
40.0000 mg | DELAYED_RELEASE_TABLET | Freq: Two times a day (BID) | ORAL | Status: DC
  Administered 2022-07-15 – 2022-07-16 (×3): 40 mg via ORAL
  Filled 2022-07-15 (×3): qty 1

## 2022-07-15 NOTE — Progress Notes (Signed)
Patient admitted to [facility] on 07/13/22.  He is under the care of the surgery service.  He has not yet been discharged.    Ivar Drape, MD [facility] [phone]

## 2022-07-15 NOTE — Progress Notes (Addendum)
Shine Mikes [id number] 1991-07-15  CARE TEAM:  PCP: Sherwood Gambler, MD  Outpatient Care Team: Patient Care Team: Borum, Vista Mink, MD as PCP - General (Internal Medicine)  Inpatient Treatment Team: Treatment Team: Attending Provider: Montez Morita, Md, MD; Utilization Review: Clydia Llano, RN; Registered Nurse: Sherian Maroon, RN; Social Worker: Nolen Mu; Pharmacist: Rollene Fare, [facility]   Problem List:   Principal Problem:   Perirectal abscess Active Problems:   Difficult airway for intubation - ?h/o largynospasm   1 Day Post-Op  07/14/2022  Preoperative Diagnosis: Perirectal abscess    Postoperative Diagnosis: Perirectal abscess    Surgical Procedure: IRRIGATION AND DEBRIDEMENT PERIRECTAL ABSCESS:     Operative Team Members:  Surgeon(s) and Role:    * Stechschulte, Hyman Hopes, MD - Primary   Findings: Perianal abscess left posterior location  Assessment  OK  [facility] Stay = 0 days)  Plan:  -Improve pain control.  Standing Tylenol.  Add gabapentin.  Most relaxants and narcotics for breakthrough. -Concern for possible aspiration event with attempted neck airway given history of laryngeal spasm and a difficult airway with intubation.  Appears not to be hypoxic but prefers to be on oxygen.  Will check chest x-ray to be safe. -Continue antibiotics x5 days.  Given penicillin allergy doing ceftriaxone and metronidazole combination.  That would provide decent pulmonary coverage as well -Antiviral regimen.  Low threshold to consult medicine if symptoms persist or he declines. -Had similar left posterior abscess in 2018 with incision and drainage.  Recurrence raises concern for possible fistula development.  Apparently no strong evidence on operation. -VTE prophylaxis- SCDs, etc -mobilize as tolerated to help recovery  Disposition:  Disposition:  The patient is from: Home  Anticipate discharge to:  Home  Anticipated Date of Discharge is:   August 6,2023    Barriers to discharge:  Pending Clinical improvement (more likely than not)  Patient currently is NOT MEDICALLY STABLE for discharge from the hospital from a surgery standpoint.      I reviewed Consultant anesthesia notes, last 24 h vitals and pain scores, last 48 h intake and output, last 24 h labs and trends, and last 24 h imaging results. I have reviewed this patient's available data, including medical history, events of note, test results, etc as part of my evaluation.  A significant portion of that time was spent in counseling.  Care during the described time interval was provided by me.  This care required moderate level of medical decision making.  07/15/2022    Subjective: (Chief complaint)  Feels like he has a sore throat and a little winded.  Not much of an appetite.  Still with pain.  Try not to ask for narcotics if he can.  Objective:  Vital signs:  Vitals:   07/14/22 1803 07/14/22 2113 07/15/22 0101 07/15/22 0630  BP: (!) 151/91 (!) 145/97 (!) 150/98 (!) 148/84  Pulse: 98 88 84 85  Resp: 18 18 18 18   Temp: 98.4 F (36.9 C) 97.9 F (36.6 C) 97.6 F (36.4 C) 97.6 F (36.4 C)  TempSrc: Oral Oral Oral Oral  SpO2: 100% 100% 100% 98%  Weight:      Height:        Last BM Date : 07/13/22  Intake/Output   Yesterday:  08/04 0701 - 08/05 0700 In: 2122.1 [P.O.:275; I.V.:1458.9; IV Piggyback:388.1] Out: 50 [Blood:50] This shift:  No intake/output data recorded.  Bowel function:  Flatus: YES  BM:  No  Drain: (No  drain)   Physical Exam:  General: Pt awake/alert in mild acute distress Eyes: PERRL, normal EOM.  Sclera clear.  No icterus Neuro: CN II-XII intact w/o focal sensory/motor deficits. Lymph: No head/neck/groin lymphadenopathy Psych:  No delerium/psychosis/paranoia.  Oriented x 4 HENT: Normocephalic, Mucus membranes moist.  No thrush.  No conversational dyspnea.  No stridor or hoarseness. Neck: Supple, No tracheal deviation.   No obvious thyromegaly Chest: No pain to chest wall compression.  Good respiratory excursion.  No audible wheezing CV:  Pulses intact.  Regular rhythm.  No major extremity edema MS: Normal AROM mjr joints.  No obvious deformity Abdomen: Soft.  Nondistended.  Mildly tender at incisions only.  No evidence of peritonitis.  No incarcerated hernias.  Rectal: Left posterior perirectal cellulitis mild.  Iodoform wick in place.  Minimal drainage.  Ext:   No deformity.  No mjr edema.  No cyanosis Skin: No petechiae / purpurea.  No major sores.  Warm and dry    Results:   Cultures: No results found for this or any previous visit (from the past 720 hour(s)).  Labs: Results for orders placed or performed during the hospital encounter of 07/13/22 (from the past 48 hour(s))  Urinalysis, Routine w reflex microscopic Urine, Clean Catch     Status: Abnormal   Collection Time: 07/13/22  9:00 PM  Result Value Ref Range   Color, Urine AMBER (A) YELLOW    Comment: BIOCHEMICALS MAY BE AFFECTED BY COLOR   APPearance CLEAR CLEAR   Specific Gravity, Urine >1.046 (H) 1.005 - 1.030   pH 5.0 5.0 - 8.0   Glucose, UA NEGATIVE NEGATIVE mg/dL   Hgb urine dipstick NEGATIVE NEGATIVE   Bilirubin Urine SMALL (A) NEGATIVE   Ketones, ur 5 (A) NEGATIVE mg/dL   Protein, ur 250 (A) NEGATIVE mg/dL   Nitrite NEGATIVE NEGATIVE   Leukocytes,Ua NEGATIVE NEGATIVE   RBC / HPF 0-5 0 - 5 RBC/hpf   WBC, UA 0-5 0 - 5 WBC/hpf   Bacteria, UA NONE SEEN NONE SEEN   Squamous Epithelial / LPF 0-5 0 - 5   Mucus PRESENT     Comment: Performed at [facility], [address], [place], [place] [address]  Comprehensive metabolic panel     Status: Abnormal   Collection Time: 07/13/22  9:04 PM  Result Value Ref Range   Sodium 139 135 - 145 mmol/L   Potassium 3.8 3.5 - 5.1 mmol/L   Chloride 108 98 - 111 mmol/L   CO2 23 22 - 32 mmol/L   Glucose, Bld 120 (H) 70 - 99 mg/dL    Comment: Glucose reference range applies  only to samples taken after fasting for at least 8 hours.   BUN 14 6 - 20 mg/dL   Creatinine, Ser 7.34 0.61 - 1.24 mg/dL   Calcium 8.9 8.9 - 19.3 mg/dL   Total Protein 8.1 6.5 - 8.1 g/dL   Albumin 4.1 3.5 - 5.0 g/dL   AST 17 15 - 41 U/L   ALT 20 0 - 44 U/L   Alkaline Phosphatase 113 38 - 126 U/L   Total Bilirubin 1.0 0.3 - 1.2 mg/dL   GFR, Estimated >79 >02 mL/min    Comment: (NOTE) Calculated using the CKD-EPI Creatinine Equation (2021)    Anion gap 8 5 - 15    Comment: Performed at [facility], [address], [place], [place] [address]  Lipase, blood     Status: None   Collection Time: 07/13/22  9:04 PM  Result Value Ref Range   Lipase 27 11 - 51 U/L    Comment: Performed at [facility], [address], [place], [place] [address]  CBC with Differential     Status: Abnormal   Collection Time: 07/13/22  9:04 PM  Result Value Ref Range   WBC 12.1 (H) 4.0 - 10.5 K/uL   RBC 5.35 4.22 - 5.81 MIL/uL   Hemoglobin 16.2 13.0 - 17.0 g/dL   HCT 19.5 09.3 - 26.7 %   MCV 87.9 80.0 - 100.0 fL   MCH 30.3 26.0 - 34.0 pg   MCHC 34.5 30.0 - 36.0 g/dL   RDW 12.4 58.0 - 99.8 %   Platelets 307 150 - 400 K/uL   nRBC 0.0 0.0 - 0.2 %   Neutrophils Relative % 70 %   Neutro Abs 8.5 (H) 1.7 - 7.7 K/uL   Lymphocytes Relative 16 %   Lymphs Abs 1.9 0.7 - 4.0 K/uL   Monocytes Relative 13 %   Monocytes Absolute 1.5 (H) 0.1 - 1.0 K/uL   Eosinophils Relative 1 %   Eosinophils Absolute 0.1 0.0 - 0.5 K/uL   Basophils Relative 0 %   Basophils Absolute 0.1 0.0 - 0.1 K/uL   Immature Granulocytes 0 %   Abs Immature Granulocytes 0.04 0.00 - 0.07 K/uL    Comment: Performed at [facility], [address], [place], [place] [address]    Imaging / Studies: CT ABDOMEN PELVIS W CONTRAST  Result Date: 07/13/2022 CLINICAL DATA:  Nausea and vomiting.  Perirectal pain. EXAM: CT ABDOMEN AND PELVIS WITH CONTRAST TECHNIQUE: Multidetector CT imaging of the  abdomen and pelvis was performed using the standard protocol following bolus administration of intravenous contrast. RADIATION DOSE REDUCTION: This exam was performed according to the departmental dose-optimization program which includes automated exposure control, adjustment of the mA and/or kV according to patient size and/or use of iterative reconstruction technique. CONTRAST:  OMNIPAQUE IOHEXOL 300 MG/ML  SOLN COMPARISON:  03/29/2017 FINDINGS: Lower Chest: Normal. Hepatobiliary: Normal hepatic contours. No intra- or extrahepatic biliary dilatation. The gallbladder is normal. Pancreas: Normal pancreas. No ductal dilatation or peripancreatic fluid collection. Spleen: Normal. Adrenals/Urinary Tract: The adrenal glands are normal. No hydronephrosis, nephroureterolithiasis or solid renal mass. The urinary bladder is normal for degree of distention Stomach/Bowel: There is no hiatal hernia. Normal duodenal course and caliber. No small bowel dilatation or inflammation. There is mild perirectal inflammatory change. At the 5 o'clock position relative to the anus, there is a fluid collection that measures 2.5 x 2.4 x 2.9 cm. Normal appendix. Vascular/Lymphatic: Normal course and caliber of the major abdominal vessels. Multiple mildly enlarged perirectal lymph nodes, measuring up to 12 mm. Reproductive: Normal prostate size with symmetric seminal vesicles. Other: None. Musculoskeletal: No bony spinal canal stenosis or focal osseous abnormality. IMPRESSION: 1. Rectal abscess measuring up to 2.9 cm at the 5 o'clock position relative to the anus, with mild perirectal inflammatory change. 2. Multiple mildly enlarged perirectal lymph nodes, likely reactive. Electronically Signed   By: Deatra Robinson M.D.   On: 07/13/2022 22:41    Medications / Allergies: per chart  Antibiotics: Anti-infectives (From admission, onward)    Start     Dose/Rate Route Frequency Ordered Stop   07/14/22 1000  dolutegravir (TIVICAY) tablet  50 mg        50 mg Oral Daily 07/14/22 0132     07/14/22 1000  emtricitabine-tenofovir AF (DESCOVY) 200-25 MG per tablet 1 tablet  1 tablet Oral Daily 07/14/22 0132     07/14/22 0200  cefTRIAXone (ROCEPHIN) 2 g in sodium chloride 0.9 % 100 mL IVPB        2 g 200 mL/hr over 30 Minutes Intravenous Every 24 hours 07/14/22 0133     07/14/22 0200  metroNIDAZOLE (FLAGYL) IVPB 500 mg        500 mg 100 mL/hr over 60 Minutes Intravenous Every 12 hours 07/14/22 0133           Note: Portions of this report may have been transcribed using voice recognition software. Every effort was made to ensure accuracy; however, inadvertent computerized transcription errors may be present.   Any transcriptional errors that result from this process are unintentional.    Ardeth Sportsman, MD, FACS, MASCRS Esophageal, Gastrointestinal & Colorectal Surgery Robotic and Minimally Invasive Surgery  [facility] [address] [place], [place] [address] [phone] Fax [phone] Main  CONTACT INFORMATION:  Weekday (9AM-5PM): Call [facility] main office at [phone]  Weeknight (5PM-9AM) or Weekend/Holiday: Check [email or web address] (password " [email or web address]") for General Surgery [facility] coverage  (Please, do not use SecureChat as it is not reliable communication to reach operating surgeons for immediate patient care)      07/15/2022  9:57 AM

## 2022-07-16 ENCOUNTER — Encounter: Payer: Self-pay | Admitting: Surgery

## 2022-07-16 LAB — CBC
HCT: 44.8 % (ref 39.0–52.0)
Hemoglobin: 15 g/dL (ref 13.0–17.0)
MCH: 30.4 pg (ref 26.0–34.0)
MCHC: 33.5 g/dL (ref 30.0–36.0)
MCV: 90.7 fL (ref 80.0–100.0)
Platelets: 236 10*3/uL (ref 150–400)
RBC: 4.94 MIL/uL (ref 4.22–5.81)
RDW: 12 % (ref 11.5–15.5)
WBC: 6.8 10*3/uL (ref 4.0–10.5)
nRBC: 0 % (ref 0.0–0.2)

## 2022-07-16 LAB — POTASSIUM: Potassium: 3.8 mmol/L (ref 3.5–5.1)

## 2022-07-16 MED ORDER — CIPROFLOXACIN HCL 500 MG PO TABS: 500.0000 mg | ORAL_TABLET | Freq: Two times a day (BID) | ORAL | 1 refills | Status: DC

## 2022-07-16 MED ORDER — TRAMADOL HCL 50 MG PO TABS: 50.0000 mg | ORAL_TABLET | Freq: Four times a day (QID) | ORAL | 0 refills | Status: DC | PRN

## 2022-07-16 MED ORDER — METRONIDAZOLE 500 MG PO TABS: 500.0000 mg | ORAL_TABLET | Freq: Two times a day (BID) | ORAL | 0 refills | Status: DC

## 2022-07-16 MED ORDER — METHOCARBAMOL 750 MG PO TABS: 750.0000 mg | ORAL_TABLET | Freq: Four times a day (QID) | ORAL | 1 refills | Status: DC | PRN

## 2022-07-16 MED ORDER — POLYETHYLENE GLYCOL 3350 17 G PO PACK
34.0000 g | PACK | Freq: Two times a day (BID) | ORAL | Status: DC
  Administered 2022-07-16: 34 g via ORAL
  Filled 2022-07-16: qty 2

## 2022-07-16 NOTE — Discharge Summary (Addendum)
Physician Discharge Summary    Patient ID: Danny Camacho MRN: [id number] DOB/AGE: 1991-02-24  31 y.o.  Patient Care Team: Sherwood Gambler, MD as PCP - General (Internal Medicine)  Admit date: 07/13/2022  Discharge date: 07/16/2022  Hospital Stay = 1 days    Discharge Diagnoses:  Principal Problem:   Perirectal abscess Active Problems:   HIV (human immunodeficiency virus infection) (HCC)   Hypertension   Difficult airway for intubation - ?h/o largynospasm   2 Days Post-Op  07/14/2022  POST-OPERATIVE DIAGNOSIS:   Perirectal abscess  SURGERY:  07/14/2022  Procedure(s): IRRIGATION AND DEBRIDEMENT PERIRECTAL ABSCESS  SURGEON:    Surgeon(s): Stechschulte, Hyman Hopes, MD  Consults: Case Management / Social Work, Anesthesia  Hospital Course:   HIV-positive male who had painful left perirectal abscess.  Underwent incision and drainage.  Had emesis event perioperatively.  Was placed on supplemental oxygen.  Wean down rather rapidly but he like to keep his nasal cannula oxygen on.  Postoperatively, the patient gradually mobilized and advanced to a solid diet.  Pain and other symptoms were treated aggressively.  Chest x-ray noted some atelectasis and scarring but no major concern for pneumonia nor pneumonitis.  By the time of discharge, the patient was walking well the hallways, eating food, having flatus.  He was breathing well.  He was in much better spirits with his mother visiting.  Pain was well-controlled on an oral medications.  Based on meeting discharge criteria and continuing to recover, I felt it was safe for the patient to be discharged from the hospital to further recover with close followup.  Given his immunosuppressed HIV-positive state, recommend we continue antibiotics for 5 more days.  Cipro/Flagyl given penicillin allergy.  Patient and mother felt reassured.  Postoperative recommendations were discussed in detail.  They are written as well.  Discharged Condition:  good  Discharge Exam: Blood pressure (!) 152/95, pulse 83, temperature (!) 97.5 F (36.4 C), temperature source Oral, resp. rate 18, height  (1.778 m), weight 108.9 kg, SpO2 100 %.  General: Pt awake/alert/oriented x4 in No acute distress.  Sitting up smiling and laughing.  More relaxed. Eyes: PERRL, normal EOM.  Sclera clear.  No icterus Neuro: CN II-XII intact w/o focal sensory/motor deficits. Lymph: No head/neck/groin lymphadenopathy Psych:  No delerium/psychosis/paranoia.  Back to his baseline of mild anxiety but much improved HENT: Normocephalic, Mucus membranes moist.  No thrush Neck: Supple, No tracheal deviation Chest: No conversational dyspnea.  Satting 100% on room air.   No chest wall pain w good excursion CV:  Pulses intact.  Regular rhythm MS: Normal AROM mjr joints.  No obvious deformity Abdomen: Soft.  Nondistended.  Nontender.  No evidence of peritonitis.  No incarcerated hernias. GU:  NEMG  Rectal.  Left posterior perirectal cellulitis markedly decreased.  I removed iodoform wick he tolerated well.  Minimal old bloody drainage.  Improved  Ext:  SCDs BLE.  No mjr edema.  No cyanosis Skin: No petechiae / purpura   Disposition:    Follow-up Information     [facility], [facility]. Go on 08/01/2022.   Specialty: General Surgery Why: 11:45 AM with Saunders Glance, PA-C. Please arrive 30 min prior to appointment time and have ID and insurance information with you. Contact information: [address] [place] [address] [phone]                 Discharge disposition: 01-Home or Self Care       Discharge Instructions  Call MD for:  hives   Complete by: As directed    Call MD for:  persistant dizziness or light-headedness   Complete by: As directed    Call MD for:  persistant nausea and vomiting   Complete by: As directed    Call MD for:  redness, tenderness, or signs of infection (pain, swelling, redness, odor or green/yellow  discharge around incision site)   Complete by: As directed    You will often notice bleeding with bowel movements.  Expect some yellow or tan drainage.  This can occur for weeks, but should be mild by the end of the first week of surgery.  Wear an absorbent pad or soft cotton gauze in your underwear until the drainage stops   Call MD for:  severe uncontrolled pain   Complete by: As directed    Diet - low sodium heart healthy   Complete by: As directed    Discharge instructions   Complete by: As directed    See Rectal Surgery instruction sheet   Discharge wound care:   Complete by: As directed    -Allow the fluffed gauze to come off with the 1st bowel movement.  Reinforce as needed if you have bleeding or soiling  -Bathe / shower every day.  Just comfortable warm water.  Avoid salts/soaps.  Keep the area clean by showering / bathing over the perianal region.   Squeeze bottles of water work as well,.  It is okay to bathe even with an open wound to help wash it.  Can try ice packs as well.  -Wet wipes or showers / gentle washing after bowel movements is often less traumatic than regular toilet paper.  Consider using a squeeze bottle of warm water to rinse the perianal region.  -You will notice bleeding, yellow drainage, and occasional stool leaking, especially with bowel movements.  This should slow down by the end of the first week of surgery, but expect some drainage for many weeks.  Replace cotton balls/pads as needed  Wear an absorbent pad or soft cotton balls on the anus as needed to catch any drainage and help keep the area clean and dry.  This is less sticking/painful than regular gauze   Driving Restrictions   Complete by: As directed    You may drive when you are no longer taking prescription pain medication, you can comfortably sit for long periods of time, and you can safely maneuver your car and apply brakes.  Expect at least 1-2 weeks   Increase activity slowly   Complete by: As  directed    Lifting restrictions   Complete by: As directed    You may resume regular (light) daily activities beginning the next day-such as daily self-care, walking, climbing stairs-gradually increasing activities as tolerated.  If you can walk 30 minutes without difficulty, it is safe to try more intense activity such as jogging, treadmill, bicycling, low-impact aerobics, swimming, etc. Save the most intensive and strenuous activity for last such as sit-ups, heavy lifting, contact sports, etc  Refrain from any heavy lifting or straining until you are off narcotics for pain control.  Remember: if it hurts to do it, don't do it: STOP   May shower / Bathe   Complete by: As directed    Warm water sitz baths/tub soaks/showers  x 20-30 minutes, up to 8 times a day for comfort  Just comfortable warm water.  Avoid salts/soaps.  Can try using ice packs as an alternative.  Consider using  a squeeze bottle of warm water to rinse the perianal region.   May walk up steps   Complete by: As directed    Sexual Activity Restrictions   Complete by: As directed    You may have sexual intercourse when it is comfortable. If it hurts to do it, STOP.       Allergies as of 07/16/2022       Reactions   Penicillins Rash   Unknown Has patient had a PCN reaction causing immediate rash, facial/tongue/throat swelling, SOB or lightheadedness with hypotension: No Has patient had a PCN reaction causing severe rash involving mucus membranes or skin necrosis: No Has patient had a PCN reaction that required hospitalization No Has patient had a PCN reaction occurring within the last 10 years: No If all of the above answers are "NO", then may proceed with Cephalosporin use.   Tramadol Nausea Only        Medication List     TAKE these medications    ciprofloxacin 500 MG tablet Commonly known as: Cipro Take 1 tablet (500 mg total) by mouth 2 (two) times daily.   emtricitabine-tenofovir AF 200-25 MG  tablet Commonly known as: DESCOVY Take 1 tablet by mouth daily.   escitalopram 10 MG tablet Commonly known as: LEXAPRO Take 10 mg by mouth daily.   ibuprofen 200 MG tablet Commonly known as: ADVIL Take 400 mg by mouth every 6 (six) hours as needed for fever or headache.   lisinopril 20 MG tablet Commonly known as: ZESTRIL Take 1 tablet (20 mg total) by mouth daily. What changed: how much to take   methocarbamol 750 MG tablet Commonly known as: ROBAXIN Take 1 tablet (750 mg total) by mouth 4 (four) times daily as needed (use for muscle cramps/pain).   metroNIDAZOLE 500 MG tablet Commonly known as: FLAGYL Take 1 tablet (500 mg total) by mouth 2 (two) times daily.   pantoprazole 20 MG tablet Commonly known as: PROTONIX Take 1 tablet (20 mg total) by mouth daily for 15 days. What changed:  how much to take when to take this   Tivicay 50 MG tablet Generic drug: dolutegravir Take 50 mg by mouth daily.               Discharge Care Instructions  (From admission, onward)           Start     Ordered   07/16/22 0000  Discharge wound care:       Comments: -Allow the fluffed gauze to come off with the 1st bowel movement.  Reinforce as needed if you have bleeding or soiling  -Bathe / shower every day.  Just comfortable warm water.  Avoid salts/soaps.  Keep the area clean by showering / bathing over the perianal region.   Squeeze bottles of water work as well,.  It is okay to bathe even with an open wound to help wash it.  Can try ice packs as well.  -Wet wipes or showers / gentle washing after bowel movements is often less traumatic than regular toilet paper.  Consider using a squeeze bottle of warm water to rinse the perianal region.  -You will notice bleeding, yellow drainage, and occasional stool leaking, especially with bowel movements.  This should slow down by the end of the first week of surgery, but expect some drainage for many weeks.  Replace cotton balls/pads  as needed  Wear an absorbent pad or soft cotton balls on the anus as needed to catch any drainage  and help keep the area clean and dry.  This is less sticking/painful than regular gauze   07/16/22 1148            Significant Diagnostic Studies:  Results for orders placed or performed during the hospital encounter of 07/13/22 (from the past 72 hour(s))  Urinalysis, Routine w reflex microscopic Urine, Clean Catch     Status: Abnormal   Collection Time: 07/13/22  9:00 PM  Result Value Ref Range   Color, Urine AMBER (A) YELLOW    Comment: BIOCHEMICALS MAY BE AFFECTED BY COLOR   APPearance CLEAR CLEAR   Specific Gravity, Urine >1.046 (H) 1.005 - 1.030   pH 5.0 5.0 - 8.0   Glucose, UA NEGATIVE NEGATIVE mg/dL   Hgb urine dipstick NEGATIVE NEGATIVE   Bilirubin Urine SMALL (A) NEGATIVE   Ketones, ur 5 (A) NEGATIVE mg/dL   Protein, ur 161100 (A) NEGATIVE mg/dL   Nitrite NEGATIVE NEGATIVE   Leukocytes,Ua NEGATIVE NEGATIVE   RBC / HPF 0-5 0 - 5 RBC/hpf   WBC, UA 0-5 0 - 5 WBC/hpf   Bacteria, UA NONE SEEN NONE SEEN   Squamous Epithelial / LPF 0-5 0 - 5   Mucus PRESENT     Comment: Performed at [facility], [address] [place], [place] [address]  Comprehensive metabolic panel     Status: Abnormal   Collection Time: 07/13/22  9:04 PM  Result Value Ref Range   Sodium 139 135 - 145 mmol/L   Potassium 3.8 3.5 - 5.1 mmol/L   Chloride 108 98 - 111 mmol/L   CO2 23 22 - 32 mmol/L   Glucose, Bld 120 (H) 70 - 99 mg/dL    Comment: Glucose reference range applies only to samples taken after fasting for at least 8 hours.   BUN 14 6 - 20 mg/dL   Creatinine, Ser 5.401.16 0.61 - 1.24 mg/dL   Calcium 8.9 8.9 - 98.110.3 mg/dL   Total Protein 8.1 6.5 - 8.1 g/dL   Albumin 4.1 3.5 - 5.0 g/dL   AST 17 15 - 41 U/L   ALT 20 0 - 44 U/L   Alkaline Phosphatase 113 38 - 126 U/L   Total Bilirubin 1.0 0.3 - 1.2 mg/dL   GFR, Estimated >19>60 >14>60 mL/min    Comment: (NOTE) Calculated using the  CKD-EPI Creatinine Equation (2021)    Anion gap 8 5 - 15    Comment: Performed at [facility], [address] [place], [place] [address]  Lipase, blood     Status: None   Collection Time: 07/13/22  9:04 PM  Result Value Ref Range   Lipase 27 11 - 51 U/L    Comment: Performed at [facility], [address] [place], [place] [address]  CBC with Differential     Status: Abnormal   Collection Time: 07/13/22  9:04 PM  Result Value Ref Range   WBC 12.1 (H) 4.0 - 10.5 K/uL   RBC 5.35 4.22 - 5.81 MIL/uL   Hemoglobin 16.2 13.0 - 17.0 g/dL   HCT 86.547.0 78.439.0 - 69.652.0 %   MCV 87.9 80.0 - 100.0 fL   MCH 30.3 26.0 - 34.0 pg   MCHC 34.5 30.0 - 36.0 g/dL   RDW 29.511.9 28.411.5 - 13.215.5 %   Platelets 307 150 - 400 K/uL   nRBC 0.0 0.0 - 0.2 %   Neutrophils Relative % 70 %   Neutro Abs 8.5 (H) 1.7 - 7.7 K/uL  Lymphocytes Relative 16 %   Lymphs Abs 1.9 0.7 - 4.0 K/uL   Monocytes Relative 13 %   Monocytes Absolute 1.5 (H) 0.1 - 1.0 K/uL   Eosinophils Relative 1 %   Eosinophils Absolute 0.1 0.0 - 0.5 K/uL   Basophils Relative 0 %   Basophils Absolute 0.1 0.0 - 0.1 K/uL   Immature Granulocytes 0 %   Abs Immature Granulocytes 0.04 0.00 - 0.07 K/uL    Comment: Performed at [facility], [address] [place], [place] [address]  CBC     Status: None   Collection Time: 07/16/22  5:21 AM  Result Value Ref Range   WBC 6.8 4.0 - 10.5 K/uL   RBC 4.94 4.22 - 5.81 MIL/uL   Hemoglobin 15.0 13.0 - 17.0 g/dL   HCT 91.4 78.2 - 95.6 %   MCV 90.7 80.0 - 100.0 fL   MCH 30.4 26.0 - 34.0 pg   MCHC 33.5 30.0 - 36.0 g/dL   RDW 21.3 08.6 - 57.8 %   Platelets 236 150 - 400 K/uL   nRBC 0.0 0.0 - 0.2 %    Comment: Performed at [facility], [address] [place], [place] [address]  Potassium     Status: None   Collection Time: 07/16/22  5:21 AM  Result Value Ref Range   Potassium 3.8 3.5 - 5.1 mmol/L    Comment: Performed at [facility], [address], [place], [place] [address]    CT ABDOMEN PELVIS W CONTRAST  Result Date: 07/13/2022 CLINICAL DATA:  Nausea and vomiting.  Perirectal pain. EXAM: CT ABDOMEN AND PELVIS WITH CONTRAST TECHNIQUE: Multidetector CT imaging of the abdomen and pelvis was performed using the standard protocol following bolus administration of intravenous contrast. RADIATION DOSE REDUCTION: This exam was performed according to the departmental dose-optimization program which includes automated exposure control, adjustment of the mA and/or kV according to patient size and/or use of iterative reconstruction technique. CONTRAST:  OMNIPAQUE IOHEXOL 300 MG/ML  SOLN COMPARISON:  03/29/2017 FINDINGS: Lower Chest: Normal. Hepatobiliary: Normal hepatic contours. No intra- or extrahepatic biliary dilatation. The gallbladder is normal. Pancreas: Normal pancreas. No ductal dilatation or peripancreatic fluid collection. Spleen: Normal. Adrenals/Urinary Tract: The adrenal glands are normal. No hydronephrosis, nephroureterolithiasis or solid renal mass. The urinary bladder is normal for degree of distention Stomach/Bowel: There is no hiatal hernia. Normal duodenal course and caliber. No small bowel dilatation or inflammation. There is mild perirectal inflammatory change. At the 5 o'clock position relative to the anus, there is a fluid collection that measures 2.5 x 2.4 x 2.9 cm. Normal appendix. Vascular/Lymphatic: Normal course and caliber of the major abdominal vessels. Multiple mildly enlarged perirectal lymph nodes, measuring up to 12 mm. Reproductive: Normal prostate size with symmetric seminal vesicles. Other: None. Musculoskeletal: No bony spinal canal stenosis or focal osseous abnormality. IMPRESSION: 1. Rectal abscess measuring up to 2.9 cm at the 5 o'clock position relative to the anus, with mild perirectal inflammatory change. 2. Multiple mildly enlarged perirectal lymph nodes, likely reactive.  Electronically Signed   By: Deatra Robinson M.D.   On: 07/13/2022 22:41    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   HIV (human immunodeficiency virus infection) (HCC)    Hypertension    Sprain of lateral ligament of ankle joint 07/29/2021    Past Surgical History:  Procedure Laterality Date   FRACTURE SURGERY  left leg fx w/ rod placement   INCISION AND DRAINAGE PERIRECTAL ABSCESS N/A 03/29/2017   Procedure: EXAM UNDER ANESTHESIA IRRIGATION AND DEBRIDEMENT RECTAL ABSCESS;  Surgeon: Chevis Pretty III, MD;  Location: [facility];  Service: General;  Laterality: N/A;   INCISION AND DRAINAGE PERIRECTAL ABSCESS N/A 07/14/2022   Procedure: IRRIGATION AND DEBRIDEMENT PERIRECTAL ABSCESS;  Surgeon: Quentin Ore, MD;  Location: [facility];  Service: General;  Laterality: N/A;   TONSILLECTOMY      Social History   Socioeconomic History   Marital status: Single    Spouse name: Not on file   Number of children: Not on file   Years of education: Not on file   Highest education level: Not on file  Occupational History   Not on file  Tobacco Use   Smoking status: Never   Smokeless tobacco: Never  Vaping Use   Vaping Use: Never used  Substance and Sexual Activity   Alcohol use: Not Currently   Drug use: Yes    Types: Marijuana   Sexual activity: Yes    Birth control/protection: None  Other Topics Concern   Not on file  Social History Narrative   Not on file   Social Determinants of Health   Financial Resource Strain: Not on file  Food Insecurity: Not on file  Transportation Needs: Not on file  Physical Activity: Not on file  Stress: Not on file  Social Connections: Not on file  Intimate Partner Violence: Not on file    History reviewed. No pertinent family history.  Current Facility-Administered Medications  Medication Dose Route Frequency Provider Last Rate Last Admin   0.9 %  sodium chloride infusion  250 mL  Intravenous PRN Karie Soda, MD       acetaminophen (TYLENOL) tablet 1,000 mg  1,000 mg Oral Q6H Barnetta Chapel, PA-C   1,000 mg at 07/16/22 0808   alum & mag hydroxide-simeth (MAALOX/MYLANTA) 200-200-20 MG/5ML suspension 30 mL  30 mL Oral Q6H PRN Stechschulte, Hyman Hopes, MD   30 mL at 07/14/22 2224   cefTRIAXone (ROCEPHIN) 2 g in sodium chloride 0.9 % 100 mL IVPB  2 g Intravenous Q24H Barnetta Chapel, PA-C 200 mL/hr at 07/16/22 0218 2 g at 07/16/22 6045   diphenhydrAMINE (BENADRYL) capsule 25 mg  25 mg Oral Q6H PRN Barnetta Chapel, PA-C       diphenhydrAMINE (BENADRYL) injection 12.5-25 mg  12.5-25 mg Intravenous Q6H PRN Karie Soda, MD       dolutegravir (TIVICAY) tablet 50 mg  50 mg Oral Daily Barnetta Chapel, PA-C   50 mg at 07/16/22 1009   emtricitabine-tenofovir AF (DESCOVY) 200-25 MG per tablet 1 tablet  1 tablet Oral Daily Barnetta Chapel, PA-C   1 tablet at 07/16/22 1009   enoxaparin (LOVENOX) injection 40 mg  40 mg Subcutaneous Q24H Barnetta Chapel, PA-C   40 mg at 07/16/22 1009   escitalopram (LEXAPRO) tablet 10 mg  10 mg Oral Daily Karie Soda, MD   10 mg at 07/16/22 1010   feeding supplement (ENSURE SURGERY) liquid 237 mL  237 mL Oral BID BM Karie Soda, MD   237 mL at 07/15/22 1214   gabapentin (NEURONTIN) capsule 200 mg  200 mg Oral TID Karie Soda, MD   200 mg at 07/16/22 1009   HYDROmorphone (DILAUDID) injection 0.5-2 mg  0.5-2 mg Intravenous Q2H PRN Karie Soda, MD   1 mg at 07/16/22 0225   ibuprofen (ADVIL) tablet 400-800 mg  400-800 mg Oral Q6H PRN Karie Soda,  MD   800 mg at 07/16/22 1010   lactated ringers bolus 1,000 mL  1,000 mL Intravenous Q8H PRN Karie Soda, MD       lip balm (CARMEX) ointment   Topical BID Karie Soda, MD   1 Application at 07/16/22 1013   lisinopril (ZESTRIL) tablet 10 mg  10 mg Oral Daily Barnetta Chapel, PA-C       magic mouthwash  15 mL Oral QID PRN Karie Soda, MD       menthol-cetylpyridinium (CEPACOL) lozenge 3 mg  1 lozenge Oral PRN  Karie Soda, MD       methocarbamol (ROBAXIN) 1,000 mg in dextrose 5 % 100 mL IVPB  1,000 mg Intravenous Q6H PRN Karie Soda, MD       methocarbamol (ROBAXIN) tablet 1,000 mg  1,000 mg Oral Q6H PRN Karie Soda, MD   1,000 mg at 07/16/22 0808   metoprolol tartrate (LOPRESSOR) injection 5 mg  5 mg Intravenous Q6H PRN Karie Soda, MD       metroNIDAZOLE (FLAGYL) IVPB 500 mg  500 mg Intravenous Q12H Barnetta Chapel, PA-C 100 mL/hr at 07/16/22 0258 500 mg at 07/16/22 0258   ondansetron (ZOFRAN) injection 4 mg  4 mg Intravenous Q6H PRN Karie Soda, MD   4 mg at 07/15/22 1700   Or   ondansetron (ZOFRAN) 8 mg in sodium chloride 0.9 % 50 mL IVPB  8 mg Intravenous Q6H PRN Karie Soda, MD       ondansetron (ZOFRAN-ODT) disintegrating tablet 4 mg  4 mg Oral Q6H PRN Barnetta Chapel, PA-C       oxyCODONE (Oxy IR/ROXICODONE) immediate release tablet 5-10 mg  5-10 mg Oral Q4H PRN Karie Soda, MD   5 mg at 07/16/22 0808   pantoprazole (PROTONIX) EC tablet 40 mg  40 mg Oral BID Karie Soda, MD   40 mg at 07/16/22 1010   phenol (CHLORASEPTIC) mouth spray 2 spray  2 spray Mouth/Throat PRN Karie Soda, MD       polyethylene glycol (MIRALAX / GLYCOLAX) packet 17 g  17 g Oral Q12H PRN Karie Soda, MD   17 g at 07/15/22 1211   polyethylene glycol (MIRALAX / GLYCOLAX) packet 34 g  34 g Oral BID Karie Soda, MD   34 g at 07/16/22 1018   prochlorperazine (COMPAZINE) injection 5-10 mg  5-10 mg Intravenous Q4H PRN Karie Soda, MD       sodium chloride flush (NS) 0.9 % injection 3 mL  3 mL Intravenous Catha Gosselin, MD   3 mL at 07/16/22 1014   sodium chloride flush (NS) 0.9 % injection 3 mL  3 mL Intravenous PRN Karie Soda, MD         Allergies  Allergen Reactions   Penicillins Rash    Unknown Has patient had a PCN reaction causing immediate rash, facial/tongue/throat swelling, SOB or lightheadedness with hypotension: No Has patient had a PCN reaction causing severe rash involving mucus  membranes or skin necrosis: No Has patient had a PCN reaction that required hospitalization No Has patient had a PCN reaction occurring within the last 10 years: No If all of the above answers are "NO", then may proceed with Cephalosporin use.    Tramadol Nausea Only    Signed:   Ardeth Sportsman, MD, FACS, MASCRS Esophageal, Gastrointestinal & Colorectal Surgery Robotic and Minimally Invasive Surgery  [facility] [address], [address] [place], [place] [address] [phone] Fax [phone]  Main  CONTACT INFORMATION:  Weekday (9AM-5PM): Call [facility] main office at [phone]  Weeknight (5PM-9AM) or Weekend/Holiday: Check [email or web address] (password " [email or web address]") for General Surgery [facility] coverage  (Please, do not use SecureChat as it is not reliable communication to reach operating surgeons for immediate patient care)      07/16/2022, 11:48 AM

## 2022-07-16 NOTE — Progress Notes (Signed)
Assessment unchanged. Pt verbalized understanding of dc instructions given including wound care, medications and follow up care. Discharged via wc to front entrance by NT.

## 2022-07-16 NOTE — Discharge Instructions (Signed)
##############################################  ANORECTAL SURGERY:  POST OPERATIVE INSTRUCTIONS  ######################################################################  EAT Start with a pureed / full liquid diet After 24 hours, gradually transition to a high fiber diet.    CONTROL PAIN Control pain so you can tolerate bowel movements,  walk, sleep, tolerate sneezing/coughing, and go up/down stairs.   HAVE A BOWEL MOVEMENT DAILY Keep your bowels regular to avoid problems.   Taking a fiber supplement every day to keep bowels soft.   Try a laxative to override constipation. Use an antidairrheal to slow down diarrhea.   Call if not better after 2 tries  WALK Walk an hour a day.  Control your pain to do that.   CALL IF YOU HAVE PROBLEMS/CONCERNS Call if you are still struggling despite following these instructions. Call if you have concerns not answered by these instructions  ######################################################################    Take your usually prescribed home medications unless otherwise directed.  DIET: Follow a light bland diet & liquids the first 24 hours after arrival home, such as soup, liquids, starches, etc.  Be sure to drink plenty of fluids.  Quickly advance to a usual solid diet within a few days.  Avoid fast food or heavy meals as your are more likely to get nauseated or have irregular bowels.  A low-fat, high-fiber diet for the rest of your life is ideal.  PAIN CONTROL: Expect swelling and discomfort in the anus/rectal area. Pain is best controlled by a usual combination of many methods TOGETHER: Warm baths/soaks or Ice packs Over the counter pain medication Prescription pain medications Topical creams    Warm water baths or ice packs (30-60 minutes up to 8 times a day, especially after bowel meovements) will help. Use ice for the first few days to help decrease swelling and bruising, then switch to heat such as warm towels, sitz baths, warm  baths, warm showers, etc to help relax tight/sore spots and speed recovery.  Some people prefer to use ice alone, heat alone, alternating between ice & heat.  Experiment to what works for you.    It is helpful to take an over-the-counter pain medication continuously for the first few weeks.  Choose one of the following that works best for you: Naproxen (Aleve, etc)  Two 220mg tabs twice a day Ibuprofen (Advil, etc) Three 200mg tabs four times a day (every meal & bedtime) Acetaminophen (Tylenol, etc) 500-650mg four times a day (every meal & bedtime)  A  prescription for pain medication (such as oxycodone, hydrocodone, etc) should be given to you upon discharge.  Take your pain medication as prescribed.  If you are having problems/concerns with the prescription medicine (does not control pain, nausea, vomiting, rash, itching, etc), please call us [phone] to see if we need to switch you to a different pain medicine that will work better for you and/or control your side effect better. If you need a refill on your pain medication, please contact your pharmacy.  They will contact our office to request authorization. Prescriptions will not be filled after 5 pm or on week-ends.  If can take up to 48 hours for it to be filled & ready so avoid waiting until you are down to thel ast pill.  A topical cream (Dibucaine) or a prescription for a cream (such as diltiazem 2% gel) may be given to you.  Many people find relief with topical creams.  Some people find it burns too much.  Experiment.  If it helps, use it.  If it burns, don't   using it.  You also may receive a prescription for diazepam, a muscle relaxant to help you to be able to urinate and defecate more easily.  It is safe to take a few doses with the other medications as long as you are not planning to drive or do anything intense.  Hopefully this can minimize the chance of needing a Foley catheter into your bladder     KEEP YOUR BOWELS  REGULAR The goal is one soft bowel movement a day Avoid getting constipated.  Between the surgery and the pain medications, it is common to experience some constipation.  Increasing fluid intake and taking a fiber supplement (such as Metamucil, Citrucel, FiberCon, MiraLax, etc) 2-4 times a day regularly will usually help prevent this problem from occurring.  A mild laxative (prune juice, Milk of Magnesia, MiraLax, etc) should be taken according to package directions if there are no bowel movements after 48 hours. Watch out for diarrhea.  If you have many loose bowel movements, simplify your diet to bland foods & liquids for a few days.  Stop any stool softeners and decrease your fiber supplement.  Switching to mild anti-diarrheal medications (Kayopectate, Pepto Bismol) can help.  Can try an imodium/loperamide dose.  If this worsens or does not improve, please call us.  Wound Care   a. You have some fluffed gauze on top of the anus to help catch drainage and bleeding.  Let the gauze fall off with the first bowel movement or shower.  It is okay to reinforce or replace as needed.  Bleeding is common at first and occasionally tapers off   b. Place soft cotton balls on the anus/wounds and use an absorbent pad in your underwear as needed to catch any drainage and help keep the area.  Try to use cotton balls or pads over regular gauze or toilet paper as gauze will stick and pull, causing pain.  Cotton will come off more easily.   c. Keep the area clean and dry.  Bathe / shower every day.  Keep the area clean by showering / bathing over the incision / wound.   It is okay to soak an open wound to help wash it.  Consider using a squeeze bottle filled with warm water to gently wash the anal area.  Wet wipes or showers / gentle washing after bowel movements is often less traumatic than regular toilet paper.  Use a Sitz Bath 4-8 times a day for relief  A sitz bath is a warm water bath taken in the sitting position  that covers only the hips and buttocks. It may be used for either healing or hygiene purposes. Sitz baths are also used to relieve pain, itching, or muscle spasms.  Gently cleaned the area and the heat will help lower spasm and offer better pain control.    Fill the bathtub half full with warm water. Sit in the water and open the drain a little. Turn on the warm water to keep the tub half full. Keep the water running constantly. Soak in the water for 15 to 20 minutes. After the sitz bath, pat the affected area dry first.   d. You will often notice bleeding, especially with bowel movements.  This should slow down by the end of the first week of surgery.  Sitting on an ice pack can help.   e. Expect some drainage.  You often will have some blood or yellow drainage with open wounds.  Sometimes she will get a little   leaking of liquid stool until the incision/wounds have fully close down.  This should slow down by the end of the first week of surgery, but you will have occasional bleeding or drainage up to a few months after surgery.  Wear an absorbent pad or soft cotton gauze in your underwear until the drainage stops.  ACTIVITIES as tolerated:    You may resume regular (light) daily activities beginning the next day--such as daily self-care, walking, climbing stairs--gradually increasing activities as tolerated.  If you can walk 30 minutes without difficulty, it is safe to try more intense activity such as jogging, treadmill, bicycling, low-impact aerobics, swimming, etc. Save the most intensive and strenuous activity for last such as sit-ups, heavy lifting, contact sports, etc  Refrain from any heavy lifting or straining until you are off narcotics for pain control.   DO NOT PUSH THROUGH PAIN.  Let pain be your guide: If it hurts to do something, don't do it.  Pain is your body warning you to avoid that activity for another week until the pain goes down. You may drive when you are no longer taking  prescription pain medication, you can comfortably sit for long periods of time, and you can safely maneuver your car and apply brakes. You may have sexual intercourse when it is comfortable.   FOLLOW UP in our office Please call [facility] at [phone] to set up an appointment to see your surgeon in the office for a follow-up appointment approximately 3 weeks after your surgery. Make sure that you call for this appointment the day you arrive home to ensure a convenient appointment time.  8. IF YOU HAVE DISABILITY OR FAMILY LEAVE FORMS, BRING THEM TO THE OFFICE FOR PROCESSING.  DO NOT GIVE THEM TO YOUR DOCTOR.        WHEN TO CALL US [phone]: Poor pain control Reactions / problems with new medications (rash/itching, nausea, etc)  Fever over 101.5 F (38.5 C) Inability to urinate Nausea and/or vomiting Worsening swelling or bruising Continued bleeding from incision. Increased pain, redness, or drainage from the incision  The clinic staff is available to answer your questions during regular business hours (8:30am-5pm).  Please don't hesitate to call and ask to speak to one of our nurses for clinical concerns.   A surgeon from [facility] is always on call at the hospitals   If you have a medical emergency, go to the nearest emergency room or call 911.    [facility] [address], [address], [place], [place]  [address] ? MAIN: [phone] ? TOLL FREE: [phone] ? FAX [phone] [email or web address]  #####################################################  

## 2023-07-24 ENCOUNTER — Emergency Department (HOSPITAL_COMMUNITY)
Admission: EM | Admit: 2023-07-24 | Discharge: 2023-07-24 | Discharge status: Against Medical Advice | Payer: Non-veteran care | Attending: Emergency Medicine | Admitting: Emergency Medicine

## 2023-07-24 ENCOUNTER — Other Ambulatory Visit: Payer: Self-pay

## 2023-07-24 ENCOUNTER — Encounter (HOSPITAL_COMMUNITY): Payer: Self-pay | Admitting: Emergency Medicine

## 2023-07-24 DIAGNOSIS — Z21 Asymptomatic human immunodeficiency virus [HIV] infection status: Secondary | ICD-10-CM | POA: Insufficient documentation

## 2023-07-24 DIAGNOSIS — I10 Essential (primary) hypertension: Secondary | ICD-10-CM | POA: Insufficient documentation

## 2023-07-24 DIAGNOSIS — Z79899 Other long term (current) drug therapy: Secondary | ICD-10-CM | POA: Diagnosis not present

## 2023-07-24 DIAGNOSIS — R112 Nausea with vomiting, unspecified: Secondary | ICD-10-CM | POA: Insufficient documentation

## 2023-07-24 DIAGNOSIS — R109 Unspecified abdominal pain: Secondary | ICD-10-CM | POA: Diagnosis not present

## 2023-07-24 DIAGNOSIS — Y900 Blood alcohol level of less than 20 mg/100 ml: Secondary | ICD-10-CM | POA: Insufficient documentation

## 2023-07-24 DIAGNOSIS — R7309 Other abnormal glucose: Secondary | ICD-10-CM | POA: Insufficient documentation

## 2023-07-24 DIAGNOSIS — Z5329 Procedure and treatment not carried out because of patient's decision for other reasons: Secondary | ICD-10-CM | POA: Insufficient documentation

## 2023-07-24 DIAGNOSIS — F10120 Alcohol abuse with intoxication, uncomplicated: Secondary | ICD-10-CM | POA: Diagnosis not present

## 2023-07-24 LAB — URINALYSIS, ROUTINE W REFLEX MICROSCOPIC
Bacteria, UA: NONE SEEN
Bilirubin Urine: NEGATIVE
Glucose, UA: NEGATIVE mg/dL
Hgb urine dipstick: NEGATIVE
Ketones, ur: NEGATIVE mg/dL
Leukocytes,Ua: NEGATIVE
Nitrite: NEGATIVE
Protein, ur: 30 mg/dL — AB
Specific Gravity, Urine: 1.025 (ref 1.005–1.030)
pH: 8 (ref 5.0–8.0)

## 2023-07-24 LAB — CBC
HCT: 48.5 % (ref 39.0–52.0)
Hemoglobin: 16.3 g/dL (ref 13.0–17.0)
MCH: 29.1 pg (ref 26.0–34.0)
MCHC: 33.6 g/dL (ref 30.0–36.0)
MCV: 86.5 fL (ref 80.0–100.0)
Platelets: 316 10*3/uL (ref 150–400)
RBC: 5.61 MIL/uL (ref 4.22–5.81)
RDW: 12.6 % (ref 11.5–15.5)
WBC: 9.8 10*3/uL (ref 4.0–10.5)
nRBC: 0 % (ref 0.0–0.2)

## 2023-07-24 LAB — COMPREHENSIVE METABOLIC PANEL
ALT: 23 U/L (ref 0–44)
AST: 23 U/L (ref 15–41)
Albumin: 4.4 g/dL (ref 3.5–5.0)
Alkaline Phosphatase: 106 U/L (ref 38–126)
Anion gap: 11 (ref 5–15)
BUN: 11 mg/dL (ref 6–20)
CO2: 22 mmol/L (ref 22–32)
Calcium: 9.2 mg/dL (ref 8.9–10.3)
Chloride: 105 mmol/L (ref 98–111)
Creatinine, Ser: 1.05 mg/dL (ref 0.61–1.24)
GFR, Estimated: >60 mL/min (ref >60)
Glucose, Bld: 109 mg/dL — ABNORMAL HIGH (ref 70–99)
Potassium: 4.2 mmol/L (ref 3.5–5.1)
Sodium: 138 mmol/L (ref 135–145)
Total Bilirubin: 1.2 mg/dL (ref 0.3–1.2)
Total Protein: 7.9 g/dL (ref 6.5–8.1)

## 2023-07-24 LAB — LIPASE, BLOOD: Lipase: 31 U/L (ref 11–51)

## 2023-07-24 LAB — ETHANOL: Alcohol, Ethyl (B): <10 mg/dL (ref <10)

## 2023-07-24 MED ORDER — PANTOPRAZOLE SODIUM 40 MG IV SOLR
40.0000 mg | Freq: Once | INTRAVENOUS | Status: AC
  Administered 2023-07-24: 40 mg via INTRAVENOUS
  Filled 2023-07-24: qty 10

## 2023-07-24 MED ORDER — METOCLOPRAMIDE HCL 5 MG/ML IJ SOLN
10.0000 mg | Freq: Once | INTRAMUSCULAR | Status: AC
  Administered 2023-07-24: 10 mg via INTRAVENOUS
  Filled 2023-07-24: qty 2

## 2023-07-24 MED ORDER — SODIUM CHLORIDE 0.9 % IV BOLUS
1000.0000 mL | Freq: Once | INTRAVENOUS | Status: AC
  Administered 2023-07-24: 1000 mL via INTRAVENOUS

## 2023-07-24 MED ORDER — PROCHLORPERAZINE EDISYLATE 10 MG/2ML IJ SOLN
10.0000 mg | Freq: Once | INTRAMUSCULAR | Status: AC
  Administered 2023-07-24: 10 mg via INTRAVENOUS
  Filled 2023-07-24: qty 2

## 2023-07-24 MED ORDER — ONDANSETRON HCL 4 MG/2ML IJ SOLN
4.0000 mg | Freq: Once | INTRAMUSCULAR | Status: AC | PRN
  Administered 2023-07-24: 4 mg via INTRAVENOUS
  Filled 2023-07-24: qty 2

## 2023-07-24 NOTE — ED Notes (Signed)
Pt ambulated to the bathroom.  

## 2023-07-24 NOTE — ED Notes (Signed)
Pt left AMA stated that he had someone to drive him to the [place].

## 2023-07-24 NOTE — ED Provider Notes (Signed)
[place] EMERGENCY DEPARTMENT AT [facility] Provider Note   CSN: [id number] Arrival date & time: 07/24/23  0424     History  Chief Complaint  Patient presents with   Nausea   Abdominal Pain    Danny Camacho is a 32 y.o. male.  The history is provided by the patient.  Abdominal Pain He has a history of hypertension, HIV infection and states that he drank a bottle of tequila and is not usually a drinker.  He has been having uncontrolled nausea and vomiting since then.  He has vomited some dark, tarry material and he does not know if it was blood.  He is also complaining of some crampy abdominal pain.  He denies other drug use.   Home Medications Prior to Admission medications   Medication Sig Start Date End Date Taking? Authorizing Provider  ciprofloxacin (CIPRO) 500 MG tablet Take 1 tablet (500 mg total) by mouth 2 (two) times daily. 07/16/22   Karie Soda, MD  dolutegravir (TIVICAY) 50 MG tablet Take 50 mg by mouth daily.    [provider]  emtricitabine-tenofovir AF (DESCOVY) 200-25 MG tablet Take 1 tablet by mouth daily.    [provider]  escitalopram (LEXAPRO) 10 MG tablet Take 10 mg by mouth daily. 06/10/20   [provider]  ibuprofen (ADVIL,MOTRIN) 200 MG tablet Take 400 mg by mouth every 6 (six) hours as needed for fever or headache. Patient not taking: Reported on 07/14/2022    [provider]  lisinopril (PRINIVIL,ZESTRIL) 20 MG tablet Take 1 tablet (20 mg total) by mouth daily. Patient taking differently: Take 10 mg by mouth daily. 11/29/15   Mady Gemma, PA-C  methocarbamol (ROBAXIN) 750 MG tablet Take 1 tablet (750 mg total) by mouth 4 (four) times daily as needed (use for muscle cramps/pain). 07/16/22   Karie Soda, MD  metroNIDAZOLE (FLAGYL) 500 MG tablet Take 1 tablet (500 mg total) by mouth 2 (two) times daily. 07/16/22   Karie Soda, MD  pantoprazole (PROTONIX) 20 MG tablet Take 1 tablet (20 mg total) by  mouth daily for 15 days. Patient taking differently: Take 40 mg by mouth 2 (two) times daily. 10/26/20 07/15/23  Tanda Rockers, PA-C      Allergies    Penicillins and Tramadol    Review of Systems   Review of Systems  Gastrointestinal:  Positive for abdominal pain.  All other systems reviewed and are negative.   Physical Exam Updated Vital Signs BP (!) 151/104 (BP Location: Right Arm)   Pulse 81   Temp 98.1 F (36.7 C) (Oral)   Resp 17   Ht 5\' 10"  (1.778 m)   Wt 104.3 kg   SpO2 99%   BMI 33.00 kg/m  Physical Exam Vitals and nursing note reviewed.     ED Results / Procedures / Treatments   Labs (all labs ordered are listed, but only abnormal results are displayed) Labs Reviewed  COMPREHENSIVE METABOLIC PANEL - Abnormal; Notable for the following components:      Result Value   Glucose, Bld 109 (*)    All other components within normal limits  LIPASE, BLOOD  CBC  URINALYSIS, ROUTINE W REFLEX MICROSCOPIC  ETHANOL    EKG None  Radiology No results found.  Procedures Procedures    Medications Ordered in ED Medications  ondansetron (ZOFRAN) injection 4 mg (4 mg Intravenous Given 07/24/23 0454)    ED Course/ Medical Decision Making/ A&P  Medical Decision Making Amount and/or Complexity of Data Reviewed Labs: ordered.  Risk Prescription drug management.   Nausea and vomiting following consumption of large amount of alcohol with possible hematemesis.  His vital signs are stable, doubt significant GI bleed.  I have ordered IV fluids, prochlorperazine for nausea, and pantoprazole.  I have ordered laboratory workup of CBC, comprehensive metabolic panel, lipase, ethanol level.  I have reviewed and interpreted his laboratory tests, and my interpretation is undetectable ethanol level, mildly elevated random glucose level and otherwise normal comprehensive metabolic panel, normal lipase, normal CBC.  He continued to have nausea in  spite of above-noted treatment and I ordered a dose of metoclopramide.  Following this, he told the nurse that he wanted to leave to go to the [facility]  He eloped before I could come to talk with him.  Final Clinical Impression(s) / ED Diagnoses Final diagnoses:  Alcohol intoxication, uncomplicated (HCC)  Nausea and vomiting, unspecified vomiting type  Elevated random blood glucose level    Rx / DC Orders ED Discharge Orders     None         Dione Booze, MD 07/24/23 [phone]

## 2023-07-24 NOTE — ED Triage Notes (Signed)
Pt reports abd pain, N/V since 7pm after drinking lots of alcohol. Pt is vomiting blood.

## 2024-11-25 ENCOUNTER — Other Ambulatory Visit: Payer: Self-pay

## 2024-11-25 ENCOUNTER — Emergency Department (HOSPITAL_COMMUNITY): Admitting: Anesthesiology

## 2024-11-25 ENCOUNTER — Emergency Department (HOSPITAL_BASED_OUTPATIENT_CLINIC_OR_DEPARTMENT_OTHER): Admitting: Anesthesiology

## 2024-11-25 ENCOUNTER — Encounter (HOSPITAL_COMMUNITY): Admission: EM | Disposition: A | Payer: Self-pay | Source: Home / Self Care | Attending: Emergency Medicine

## 2024-11-25 ENCOUNTER — Emergency Department (HOSPITAL_BASED_OUTPATIENT_CLINIC_OR_DEPARTMENT_OTHER)

## 2024-11-25 ENCOUNTER — Encounter (HOSPITAL_BASED_OUTPATIENT_CLINIC_OR_DEPARTMENT_OTHER): Payer: Self-pay

## 2024-11-25 ENCOUNTER — Observation Stay (HOSPITAL_BASED_OUTPATIENT_CLINIC_OR_DEPARTMENT_OTHER)
Admission: EM | Admit: 2024-11-25 | Discharge: 2024-11-26 | Disposition: A | Attending: Emergency Medicine | Admitting: Emergency Medicine

## 2024-11-25 DIAGNOSIS — K611 Rectal abscess: Secondary | ICD-10-CM

## 2024-11-25 DIAGNOSIS — I10 Essential (primary) hypertension: Secondary | ICD-10-CM | POA: Diagnosis not present

## 2024-11-25 DIAGNOSIS — K6289 Other specified diseases of anus and rectum: Secondary | ICD-10-CM | POA: Diagnosis not present

## 2024-11-25 DIAGNOSIS — Z743 Need for continuous supervision: Secondary | ICD-10-CM | POA: Diagnosis not present

## 2024-11-25 DIAGNOSIS — B20 Human immunodeficiency virus [HIV] disease: Secondary | ICD-10-CM | POA: Diagnosis not present

## 2024-11-25 HISTORY — PX: INCISION AND DRAINAGE PERIRECTAL ABSCESS: SHX1804

## 2024-11-25 LAB — CBC
HCT: 46.7 % (ref 39.0–52.0)
Hemoglobin: 16.1 g/dL (ref 13.0–17.0)
MCH: 30.9 pg (ref 26.0–34.0)
MCHC: 34.5 g/dL (ref 30.0–36.0)
MCV: 89.6 fL (ref 80.0–100.0)
Platelets: 234 K/uL (ref 150–400)
RBC: 5.21 MIL/uL (ref 4.22–5.81)
RDW: 12.9 % (ref 11.5–15.5)
WBC: 18.2 K/uL — ABNORMAL HIGH (ref 4.0–10.5)
nRBC: 0 % (ref 0.0–0.2)

## 2024-11-25 LAB — CBC WITH DIFFERENTIAL/PLATELET
Abs Immature Granulocytes: 0.03 K/uL (ref 0.00–0.07)
Basophils Absolute: 0 K/uL (ref 0.0–0.1)
Basophils Relative: 0 %
Eosinophils Absolute: 0.1 K/uL (ref 0.0–0.5)
Eosinophils Relative: 0 %
HCT: 44.5 % (ref 39.0–52.0)
Hemoglobin: 15.4 g/dL (ref 13.0–17.0)
Immature Granulocytes: 0 %
Lymphocytes Relative: 13 %
Lymphs Abs: 1.7 K/uL (ref 0.7–4.0)
MCH: 30.7 pg (ref 26.0–34.0)
MCHC: 34.6 g/dL (ref 30.0–36.0)
MCV: 88.8 fL (ref 80.0–100.0)
Monocytes Absolute: 1.6 K/uL — ABNORMAL HIGH (ref 0.1–1.0)
Monocytes Relative: 12 %
Neutro Abs: 9.8 K/uL — ABNORMAL HIGH (ref 1.7–7.7)
Neutrophils Relative %: 75 %
Platelets: 238 K/uL (ref 150–400)
RBC: 5.01 MIL/uL (ref 4.22–5.81)
RDW: 12.8 % (ref 11.5–15.5)
WBC: 13.2 K/uL — ABNORMAL HIGH (ref 4.0–10.5)
nRBC: 0 % (ref 0.0–0.2)

## 2024-11-25 LAB — BASIC METABOLIC PANEL WITH GFR
Anion gap: 11 (ref 5–15)
BUN: 8 mg/dL (ref 6–20)
CO2: 23 mmol/L (ref 22–32)
Calcium: 8.9 mg/dL (ref 8.9–10.3)
Chloride: 107 mmol/L (ref 98–111)
Creatinine, Ser: 1.15 mg/dL (ref 0.61–1.24)
GFR, Estimated: >60 mL/min (ref >60)
Glucose, Bld: 108 mg/dL — ABNORMAL HIGH (ref 70–99)
Potassium: 3.6 mmol/L (ref 3.5–5.1)
Sodium: 140 mmol/L (ref 135–145)

## 2024-11-25 LAB — CREATININE, SERUM
Creatinine, Ser: 1.03 mg/dL (ref 0.61–1.24)
GFR, Estimated: >60 mL/min (ref >60)

## 2024-11-25 SURGERY — INCISION AND DRAINAGE, ABSCESS, PERIRECTAL
Anesthesia: General | Site: Rectum

## 2024-11-25 MED ORDER — SUGAMMADEX SODIUM 200 MG/2ML IV SOLN
INTRAVENOUS | Status: AC
  Filled 2024-11-25: qty 2

## 2024-11-25 MED ORDER — FENTANYL CITRATE (PF) 100 MCG/2ML IJ SOLN
INTRAMUSCULAR | Status: AC
  Filled 2024-11-25: qty 2

## 2024-11-25 MED ORDER — FENTANYL CITRATE (PF) 50 MCG/ML IJ SOSY
100.0000 ug | PREFILLED_SYRINGE | Freq: Once | INTRAMUSCULAR | Status: AC
  Administered 2024-11-25: 03:00:00 100 ug via INTRAVENOUS
  Filled 2024-11-25: qty 2

## 2024-11-25 MED ORDER — OXYCODONE HCL 5 MG/5ML PO SOLN: 5.0000 mg | Freq: Once | ORAL | Status: DC | PRN

## 2024-11-25 MED ORDER — METHOCARBAMOL 1000 MG/10ML IJ SOLN: 500.0000 mg | Freq: Three times a day (TID) | INTRAMUSCULAR | Status: DC | PRN

## 2024-11-25 MED ORDER — EMTRICITABINE-TENOFOVIR AF 200-25 MG PO TABS
1.0000 | ORAL_TABLET | Freq: Every day | ORAL | Status: DC
  Administered 2024-11-25 – 2024-11-26 (×2): 1 via ORAL
  Filled 2024-11-25 (×2): qty 1

## 2024-11-25 MED ORDER — SUCCINYLCHOLINE CHLORIDE 200 MG/10ML IV SOSY
PREFILLED_SYRINGE | INTRAVENOUS | Status: DC | PRN
  Administered 2024-11-25 (×3): 10 mg via INTRAVENOUS

## 2024-11-25 MED ORDER — SODIUM CHLORIDE 0.9 % IR SOLN
Status: DC | PRN
  Administered 2024-11-25: 14:00:00 1000 mL

## 2024-11-25 MED ORDER — PANTOPRAZOLE SODIUM 20 MG PO TBEC
20.0000 mg | DELAYED_RELEASE_TABLET | Freq: Every day | ORAL | Status: DC
  Administered 2024-11-25 – 2024-11-26 (×2): 20 mg via ORAL
  Filled 2024-11-25 (×2): qty 1

## 2024-11-25 MED ORDER — LIDOCAINE HCL (CARDIAC) PF 100 MG/5ML IV SOSY
PREFILLED_SYRINGE | INTRAVENOUS | Status: DC | PRN
  Administered 2024-11-25: 13:00:00 40 mg via INTRATRACHEAL
  Administered 2024-11-25: 13:00:00 60 mg via INTRATRACHEAL

## 2024-11-25 MED ORDER — ONDANSETRON HCL 4 MG/2ML IJ SOLN
INTRAMUSCULAR | Status: AC
  Filled 2024-11-25: qty 2

## 2024-11-25 MED ORDER — MIDAZOLAM HCL 5 MG/5ML IJ SOLN
INTRAMUSCULAR | Status: DC | PRN
  Administered 2024-11-25: 13:00:00 2 mg via INTRAVENOUS

## 2024-11-25 MED ORDER — HYDROMORPHONE HCL 1 MG/ML IJ SOLN: 0.5000 mg | INTRAMUSCULAR | Status: DC | PRN

## 2024-11-25 MED ORDER — ONDANSETRON HCL 4 MG/2ML IJ SOLN: 4.0000 mg | Freq: Four times a day (QID) | INTRAMUSCULAR | Status: DC | PRN

## 2024-11-25 MED ORDER — ACETAMINOPHEN 10 MG/ML IV SOLN
INTRAVENOUS | Status: AC
  Filled 2024-11-25: qty 100

## 2024-11-25 MED ORDER — LACTATED RINGERS IV SOLN: INTRAVENOUS | Status: DC

## 2024-11-25 MED ORDER — ONDANSETRON HCL 4 MG/2ML IJ SOLN
INTRAMUSCULAR | Status: DC | PRN
  Administered 2024-11-25: 13:00:00 4 mg via INTRAVENOUS

## 2024-11-25 MED ORDER — FENTANYL CITRATE (PF) 50 MCG/ML IJ SOSY
100.0000 ug | PREFILLED_SYRINGE | INTRAMUSCULAR | Status: DC | PRN
  Administered 2024-11-25 (×3): 100 ug via INTRAVENOUS
  Filled 2024-11-25 (×3): qty 2

## 2024-11-25 MED ORDER — ONDANSETRON 4 MG PO TBDP: 4.0000 mg | ORAL_TABLET | Freq: Four times a day (QID) | ORAL | Status: DC | PRN

## 2024-11-25 MED ORDER — BUPIVACAINE-EPINEPHRINE 0.25% -1:200000 IJ SOLN
INTRAMUSCULAR | Status: DC | PRN
  Administered 2024-11-25: 14:00:00 10 mL

## 2024-11-25 MED ORDER — SODIUM CHLORIDE 0.9 % IV BOLUS
1000.0000 mL | Freq: Once | INTRAVENOUS | Status: AC
  Administered 2024-11-25: 05:00:00 1000 mL via INTRAVENOUS

## 2024-11-25 MED ORDER — SODIUM CHLORIDE 0.9 % IV SOLN
2.0000 g | Freq: Once | INTRAVENOUS | Status: AC
  Administered 2024-11-25: 03:00:00 2 g via INTRAVENOUS
  Filled 2024-11-25: qty 20

## 2024-11-25 MED ORDER — ACETAMINOPHEN 500 MG PO TABS
1000.0000 mg | ORAL_TABLET | Freq: Four times a day (QID) | ORAL | Status: DC
  Administered 2024-11-25 – 2024-11-26 (×2): 1000 mg via ORAL
  Filled 2024-11-25 (×3): qty 2

## 2024-11-25 MED ORDER — MIDAZOLAM HCL 2 MG/2ML IJ SOLN
INTRAMUSCULAR | Status: AC
  Filled 2024-11-25: qty 2

## 2024-11-25 MED ORDER — ONDANSETRON HCL 4 MG/2ML IJ SOLN
4.0000 mg | Freq: Once | INTRAMUSCULAR | Status: AC
  Administered 2024-11-25: 03:00:00 4 mg via INTRAVENOUS
  Filled 2024-11-25: qty 2

## 2024-11-25 MED ORDER — ESCITALOPRAM OXALATE 10 MG PO TABS
10.0000 mg | ORAL_TABLET | Freq: Every day | ORAL | Status: DC
  Administered 2024-11-26: 10:00:00 10 mg via ORAL
  Filled 2024-11-25: qty 1

## 2024-11-25 MED ORDER — PROPOFOL 10 MG/ML IV BOLUS
INTRAVENOUS | Status: AC
  Filled 2024-11-25: qty 20

## 2024-11-25 MED ORDER — LIDOCAINE HCL (PF) 2 % IJ SOLN
INTRAMUSCULAR | Status: AC
  Filled 2024-11-25: qty 5

## 2024-11-25 MED ORDER — METOPROLOL TARTRATE 5 MG/5ML IV SOLN: 5.0000 mg | Freq: Four times a day (QID) | INTRAVENOUS | Status: DC | PRN

## 2024-11-25 MED ORDER — OXYCODONE HCL 5 MG PO TABS: 5.0000 mg | ORAL_TABLET | Freq: Once | ORAL | Status: DC | PRN

## 2024-11-25 MED ORDER — FENTANYL CITRATE (PF) 100 MCG/2ML IJ SOLN
INTRAMUSCULAR | Status: DC | PRN
  Administered 2024-11-25: 13:00:00 50 ug via INTRAVENOUS
  Administered 2024-11-25: 13:00:00 100 ug via INTRAVENOUS
  Administered 2024-11-25: 13:00:00 50 ug via INTRAVENOUS

## 2024-11-25 MED ORDER — HYDROMORPHONE HCL 1 MG/ML IJ SOLN
INTRAMUSCULAR | Status: DC | PRN
  Administered 2024-11-25: 14:00:00 .5 mg via INTRAVENOUS

## 2024-11-25 MED ORDER — POLYETHYLENE GLYCOL 3350 17 G PO PACK: 17.0000 g | PACK | Freq: Every day | ORAL | Status: DC | PRN

## 2024-11-25 MED ORDER — KETAMINE HCL 50 MG/5ML IJ SOSY
PREFILLED_SYRINGE | INTRAMUSCULAR | Status: DC | PRN
  Administered 2024-11-25: 13:00:00 30 mg via INTRAVENOUS

## 2024-11-25 MED ORDER — SODIUM CHLORIDE 0.9 % IV SOLN
2.0000 g | Freq: Every day | INTRAVENOUS | Status: AC
  Administered 2024-11-25: 21:00:00 2 g via INTRAVENOUS
  Filled 2024-11-25: qty 20

## 2024-11-25 MED ORDER — DOCUSATE SODIUM 100 MG PO CAPS
100.0000 mg | ORAL_CAPSULE | Freq: Two times a day (BID) | ORAL | Status: DC
  Administered 2024-11-25 – 2024-11-26 (×2): 100 mg via ORAL
  Filled 2024-11-25 (×2): qty 1

## 2024-11-25 MED ORDER — METHOCARBAMOL 500 MG PO TABS
500.0000 mg | ORAL_TABLET | Freq: Three times a day (TID) | ORAL | Status: DC | PRN
  Administered 2024-11-25 – 2024-11-26 (×2): 500 mg via ORAL
  Filled 2024-11-25 (×2): qty 1

## 2024-11-25 MED ORDER — IBUPROFEN 400 MG PO TABS: 600.0000 mg | ORAL_TABLET | Freq: Four times a day (QID) | ORAL | Status: DC | PRN

## 2024-11-25 MED ORDER — ROCURONIUM BROMIDE 10 MG/ML (PF) SYRINGE
PREFILLED_SYRINGE | INTRAVENOUS | Status: AC
  Filled 2024-11-25: qty 10

## 2024-11-25 MED ORDER — KETAMINE HCL 50 MG/5ML IJ SOSY
PREFILLED_SYRINGE | INTRAMUSCULAR | Status: AC
  Filled 2024-11-25: qty 5

## 2024-11-25 MED ORDER — ACETAMINOPHEN 500 MG PO TABS: 1000.0000 mg | ORAL_TABLET | ORAL | Status: DC

## 2024-11-25 MED ORDER — DOLUTEGRAVIR SODIUM 50 MG PO TABS: 50.0000 mg | ORAL_TABLET | Freq: Every day | ORAL | Status: DC

## 2024-11-25 MED ORDER — LISINOPRIL 10 MG PO TABS
10.0000 mg | ORAL_TABLET | Freq: Every day | ORAL | Status: DC
  Filled 2024-11-25 (×2): qty 1

## 2024-11-25 MED ORDER — ENOXAPARIN SODIUM 40 MG/0.4ML IJ SOSY
40.0000 mg | PREFILLED_SYRINGE | INTRAMUSCULAR | Status: DC
  Filled 2024-11-25: qty 0.4

## 2024-11-25 MED ORDER — CHLORHEXIDINE GLUCONATE 0.12 % MT SOLN
15.0000 mL | Freq: Once | OROMUCOSAL | Status: AC
  Administered 2024-11-25: 11:00:00 15 mL via OROMUCOSAL

## 2024-11-25 MED ORDER — HYDROMORPHONE HCL 1 MG/ML IJ SOLN
0.5000 mg | Freq: Once | INTRAMUSCULAR | Status: AC
  Administered 2024-11-25: 12:00:00 0.5 mg via INTRAVENOUS
  Filled 2024-11-25: qty 1

## 2024-11-25 MED ORDER — PROPOFOL 10 MG/ML IV BOLUS
INTRAVENOUS | Status: DC | PRN
  Administered 2024-11-25: 13:00:00 70 mg via INTRAVENOUS
  Administered 2024-11-25: 13:00:00 80 mg via INTRAVENOUS
  Administered 2024-11-25: 13:00:00 200 mg via INTRAVENOUS

## 2024-11-25 MED ORDER — IOHEXOL 300 MG/ML  SOLN
100.0000 mL | Freq: Once | INTRAMUSCULAR | Status: AC | PRN
  Administered 2024-11-25: 03:00:00 100 mL via INTRAVENOUS

## 2024-11-25 MED ORDER — ACETAMINOPHEN 10 MG/ML IV SOLN
INTRAVENOUS | Status: DC | PRN
  Administered 2024-11-25: 13:00:00 1000 mg via INTRAVENOUS

## 2024-11-25 MED ORDER — METRONIDAZOLE 500 MG/100ML IV SOLN
500.0000 mg | Freq: Two times a day (BID) | INTRAVENOUS | Status: AC
  Administered 2024-11-25 – 2024-11-26 (×2): 500 mg via INTRAVENOUS
  Filled 2024-11-25 (×2): qty 100

## 2024-11-25 MED ORDER — ONDANSETRON HCL 4 MG/2ML IJ SOLN: 4.0000 mg | Freq: Three times a day (TID) | INTRAMUSCULAR | Status: DC | PRN

## 2024-11-25 MED ORDER — HYDROMORPHONE HCL 2 MG/ML IJ SOLN
INTRAMUSCULAR | Status: AC
  Filled 2024-11-25: qty 1

## 2024-11-25 MED ORDER — OXYCODONE HCL 5 MG PO TABS
5.0000 mg | ORAL_TABLET | ORAL | Status: DC | PRN
  Administered 2024-11-25: 21:00:00 5 mg via ORAL
  Administered 2024-11-26 (×2): 10 mg via ORAL
  Filled 2024-11-25: qty 1
  Filled 2024-11-25 (×2): qty 2

## 2024-11-25 MED ORDER — METRONIDAZOLE 500 MG/100ML IV SOLN
500.0000 mg | Freq: Once | INTRAVENOUS | Status: AC
  Administered 2024-11-25: 04:00:00 500 mg via INTRAVENOUS
  Filled 2024-11-25: qty 100

## 2024-11-25 MED ORDER — HYDROMORPHONE HCL 1 MG/ML IJ SOLN: 0.2500 mg | INTRAMUSCULAR | Status: DC | PRN

## 2024-11-25 MED ORDER — SIMETHICONE 80 MG PO CHEW: 40.0000 mg | CHEWABLE_TABLET | Freq: Four times a day (QID) | ORAL | Status: DC | PRN

## 2024-11-25 MED ORDER — DEXAMETHASONE SOD PHOSPHATE PF 10 MG/ML IJ SOLN
INTRAMUSCULAR | Status: DC | PRN
  Administered 2024-11-25: 13:00:00 5 mg via INTRAVENOUS

## 2024-11-25 SURGICAL SUPPLY — 27 items
BAG COUNTER SPONGE SURGICOUNT (BAG) IMPLANT
BLADE SURG 15 STRL LF DISP TIS (BLADE) ×1 IMPLANT
BRIEF MESH DISP 2XL (UNDERPADS AND DIAPERS) IMPLANT
CLEANER TIP ELECTROSURG 2X2 (MISCELLANEOUS) IMPLANT
ELECT REM PT RETURN 15FT ADLT (MISCELLANEOUS) IMPLANT
GAUZE 4X4 16PLY ~~LOC~~+RFID DBL (SPONGE) ×1 IMPLANT
GAUZE PAD ABD 8X10 STRL (GAUZE/BANDAGES/DRESSINGS) ×1 IMPLANT
GAUZE SPONGE 4X4 12PLY STRL (GAUZE/BANDAGES/DRESSINGS) ×1 IMPLANT
GAUZE SPONGE 4X4 12PLY STRL LF (GAUZE/BANDAGES/DRESSINGS) IMPLANT
GLOVE BIO SURGEON STRL SZ7.5 (GLOVE) ×1 IMPLANT
GOWN STRL REUS W/ TWL LRG LVL3 (GOWN DISPOSABLE) ×2 IMPLANT
KIT BASIN OR (CUSTOM PROCEDURE TRAY) ×1 IMPLANT
KIT TURNOVER KIT A (KITS) ×1 IMPLANT
NDL HYPO 22X1.5 SAFETY MO (MISCELLANEOUS) IMPLANT
NEEDLE HYPO 22X1.5 SAFETY MO (MISCELLANEOUS) IMPLANT
NS IRRIG 1000ML POUR BTL (IV SOLUTION) ×1 IMPLANT
PACK LITHOTOMY IV (CUSTOM PROCEDURE TRAY) ×1 IMPLANT
PACKING GAUZE IODOFORM 1INX5YD (GAUZE/BANDAGES/DRESSINGS) IMPLANT
PENCIL SMOKE EVACUATOR (MISCELLANEOUS) IMPLANT
SOL PREP POV-IOD 4OZ 10% (MISCELLANEOUS) IMPLANT
SPONGE SURGIFOAM ABS GEL 12-7 (HEMOSTASIS) IMPLANT
SWAB COLLECTION DEVICE MRSA (MISCELLANEOUS) ×1 IMPLANT
SWAB CULTURE ESWAB REG 1ML (MISCELLANEOUS) ×1 IMPLANT
SYR CONTROL 10ML LL (SYRINGE) IMPLANT
TOWEL OR DSP ST BLU DLX 10/PK (DISPOSABLE) ×1 IMPLANT
UNDERPAD 30X36 HEAVY ABSORB (UNDERPADS AND DIAPERS) ×1 IMPLANT
YANKAUER SUCT BULB TIP NO VENT (SUCTIONS) ×1 IMPLANT

## 2024-11-25 NOTE — ED Notes (Signed)
 Last oral intake 2300 last pm (11/24/2024).

## 2024-11-25 NOTE — Anesthesia Preprocedure Evaluation (Signed)
 Anesthesia Evaluation  Patient identified by MRN, date of birth, ID band Patient awake    Reviewed: Allergy & Precautions, NPO status , Patient's Chart, lab work & pertinent test results  Airway Mallampati: I  TM Distance: >3 FB Neck ROM: Full    Dental  (+) Dental Advisory Given, Teeth Intact   Pulmonary neg pulmonary ROS, neg shortness of breath, neg sleep apnea, neg COPD, neg recent URI          Cardiovascular hypertension, Pt. on medications (-) angina (-) Past MI and (-) CHF  Rhythm:Regular     Neuro/Psych negative neurological ROS  negative psych ROS   GI/Hepatic negative GI ROS, Neg liver ROS,,,  Endo/Other  negative endocrine ROS    Renal/GU negative Renal ROS     Musculoskeletal negative musculoskeletal ROS (+)    Abdominal   Peds  Hematology  (+) HIV  Anesthesia Other Findings Perirectal abscess  Reproductive/Obstetrics                              Anesthesia Physical Anesthesia Plan  ASA: 2  Anesthesia Plan: General   Post-op Pain Management: Dilaudid  IV   Induction: Intravenous  PONV Risk Score and Plan: 3 and Ondansetron , Dexamethasone , Midazolam  and Treatment may vary due to age or medical condition  Airway Management Planned: Oral ETT and LMA  Additional Equipment: None  Intra-op Plan:   Post-operative Plan: Extubation in OR  Informed Consent: I have reviewed the patients History and Physical, chart, labs and discussed the procedure including the risks, benefits and alternatives for the proposed anesthesia with the patient or authorized representative who has indicated his/her understanding and acceptance.     Dental advisory given  Plan Discussed with: CRNA  Anesthesia Plan Comments:          Anesthesia Quick Evaluation

## 2024-11-25 NOTE — ED Notes (Signed)
 [facility] at bedside

## 2024-11-25 NOTE — Op Note (Signed)
 11/25/2024  1:41 PM  PATIENT:  Danny Camacho  33 y.o. male  PRE-OPERATIVE DIAGNOSIS:  perirectal abscess  POST-OPERATIVE DIAGNOSIS:  perirectal abscess  PROCEDURE:  Procedures: INCISION AND DRAINAGE, ABSCESS, PERIRECTAL (N/A)  SURGEON:  Surgeons and Role:    * Curvin Deward MOULD, MD - Primary  PHYSICIAN ASSISTANT:   ASSISTANTS: none   ANESTHESIA:   local and general  EBL:  10cc   BLOOD ADMINISTERED:none  DRAINS: none   LOCAL MEDICATIONS USED:  MARCAINE      SPECIMEN:  No Specimen  DISPOSITION OF SPECIMEN:  N/A  COUNTS:  YES  TOURNIQUET:  * No tourniquets in log *  DICTATION: .Dragon Dictation  After informed consent was obtained the patient was brought to the operating room and placed in the supine position on the operating table.  After adequate induction of general anesthesia the patient was moved in the lithotomy position and all pressure points were padded.  The perirectal area was prepped with Betadine and draped in usual sterile manner.  An appropriate timeout was performed.  The perirectal area was then infiltrated with quarter percent Marcaine .  There was no external sign of abscess.  I could not feel any fluctuance on digital exam.  I then placed a bullet retractor inside the rectum and was able to identify pus draining posteriorly.  I inserted a hemostat through the opening where the pus was coming out and opened it more adequately so that all of the pus could drain out.  Cultures were obtained.  The mucosal edges were then cauterized with the Bovie electrocautery until they were hemostatic.  I then placed a piece of Gelfoam inside the rectum overlying the opening.  Sterile dressings were then applied.  The patient tolerated the procedure well.  At the end of the case all needle sponge and instrument counts were correct.  The patient was then awakened and taken to recovery in stable condition.  PLAN OF CARE: Admit for overnight observation  PATIENT DISPOSITION:  PACU -  hemodynamically stable.   Delay start of Pharmacological VTE agent (>24hrs) due to surgical blood loss or risk of bleeding: not applicable

## 2024-11-25 NOTE — H&P (Signed)
 H&P Note  Danny Camacho 06-04-1991  [id number].    Requesting MD: Caron Salt, DO Chief Complaint/Reason for Consult: Perirectal abscess HPI:  Patient is a 33 year old male who presented to the ED with 2 days of rectal pain and concern for perirectal abscess. Reports difficulty with stooling and fever and chills. Denies nausea, vomiting, abdominal pain. Denies recent sexual activity. Patient has had this before and required I&D. PMH significant for HTN and HIV both of which are well controlled. Not on blood thinners. Allergy to [facility] and reported intolerance to tramadol .  He came to the emergency department where a CT scan showed a very small perirectal abscess fairly high on the rectum.  ROS: Negative other than HPI  History reviewed. No pertinent family history.  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   HIV (human immunodeficiency virus infection) (HCC)    Hypertension    Sprain of lateral ligament of ankle joint 07/29/2021    Past Surgical History:  Procedure Laterality Date   FRACTURE SURGERY     left leg fx w/ rod placement   INCISION AND DRAINAGE PERIRECTAL ABSCESS N/A 03/29/2017   Procedure: EXAM UNDER ANESTHESIA IRRIGATION AND DEBRIDEMENT RECTAL ABSCESS;  Surgeon: Deward Null III, MD;  Location: [facility];  Service: General;  Laterality: N/A;   INCISION AND DRAINAGE PERIRECTAL ABSCESS N/A 07/14/2022   Procedure: IRRIGATION AND DEBRIDEMENT PERIRECTAL ABSCESS;  Surgeon: Lyndel Deward PARAS, MD;  Location: [facility];  Service: General;  Laterality: N/A;   TONSILLECTOMY      Social History:  reports that he has never smoked. He has never used smokeless tobacco. He reports that he does not currently use alcohol. He reports current drug use. Drug: Marijuana.  Allergies: Allergies[1]  (Not in a hospital admission)   Blood pressure (!) 168/96, pulse (!) 105, temperature 98.6 F (37 C), temperature source Oral, resp.  rate 20, height 5' 10 (1.778 m), weight 99.8 kg, SpO2 96%. Physical Exam:  General: pleasant, WD, WN male who is laying in bed in NAD HEENT: head is normocephalic, atraumatic.  Sclera are noninjected.  Pupils equal and round.  Ears and nose without any masses or lesions.  Mouth is pink and moist Heart: regular, rate, and rhythm.  Palpable radial and pedal pulses bilaterally Lungs: CTAB, no wheezes, rhonchi, or rales noted.  Respiratory effort nonlabored Abd: soft, NT, ND, no masses, hernias, or organomegaly GU: perirectal tenderness MS: all 4 extremities are symmetrical with no cyanosis, clubbing, or edema. Skin: warm and dry with no masses, lesions, or rashes Neuro: Cranial nerves 2-12 grossly intact, sensation is normal throughout Psych: A&Ox3 with an appropriate affect.   Results for orders placed or performed during the hospital encounter of 11/25/24 (from the past 48 hours)  CBC with Differential     Status: Abnormal   Collection Time: 11/25/24  1:31 AM  Result Value Ref Range   WBC 13.2 (H) 4.0 - 10.5 K/uL   RBC 5.01 4.22 - 5.81 MIL/uL   Hemoglobin 15.4 13.0 - 17.0 g/dL   HCT 55.4 60.9 - 47.9 %   MCV 88.8 80.0 - 100.0 fL   MCH 30.7 26.0 - 34.0 pg   MCHC 34.6 30.0 - 36.0 g/dL   RDW 87.1 88.4 - 84.4 %   Platelets 238 150 - 400 K/uL   nRBC 0.0 0.0 - 0.2 %   Neutrophils Relative % 75 %   Neutro Abs 9.8 (  H) 1.7 - 7.7 K/uL   Lymphocytes Relative 13 %   Lymphs Abs 1.7 0.7 - 4.0 K/uL   Monocytes Relative 12 %   Monocytes Absolute 1.6 (H) 0.1 - 1.0 K/uL   Eosinophils Relative 0 %   Eosinophils Absolute 0.1 0.0 - 0.5 K/uL   Basophils Relative 0 %   Basophils Absolute 0.0 0.0 - 0.1 K/uL   Immature Granulocytes 0 %   Abs Immature Granulocytes 0.03 0.00 - 0.07 K/uL    Comment: Performed at [facility], [address], [place], [place] [address]  Basic metabolic panel     Status: Abnormal   Collection Time: 11/25/24  1:31 AM  Result Value Ref Range   Sodium  140 135 - 145 mmol/L   Potassium 3.6 3.5 - 5.1 mmol/L   Chloride 107 98 - 111 mmol/L   CO2 23 22 - 32 mmol/L   Glucose, Bld 108 (H) 70 - 99 mg/dL    Comment: Glucose reference range applies only to samples taken after fasting for at least 8 hours.   BUN 8 6 - 20 mg/dL   Creatinine, Ser 8.84 0.61 - 1.24 mg/dL   Calcium 8.9 8.9 - 89.6 mg/dL   GFR, Estimated >39 >39 mL/min    Comment: (NOTE) Calculated using the CKD-EPI Creatinine Equation (2021)    Anion gap 11 5 - 15    Comment: Performed at [facility], [address] [place], [place] [address]   CT PELVIS W CONTRAST Result Date: 11/25/2024 EXAM: CT PELVIS, WITH IV CONTRAST 11/25/2024 02:52:51 AM TECHNIQUE: Axial images were acquired through the pelvis with IV contrast. Reformatted images were reviewed. Automated exposure control, iterative reconstruction, and/or weight based adjustment of the mA/kV was utilized to reduce the radiation dose to as low as reasonably achievable. COMPARISON: 07/13/2022. CLINICAL HISTORY: Perianal abscess or fistula suspected. Perirectal pain, evaluate for possible abscess. FINDINGS: BONES: Bony structures are within normal limits. JOINTS: No dislocation. The joint spaces are normal. SOFT TISSUES: Adjacent to the distal rectum to the left of the midline, there is a small 2.0 x 1.9 x 2.3 cm fluid collection. This is consistent with a small perirectal abscess. No extension to the skin surface is identified. No other fluid collection is seen. INTRAPELVIC CONTENTS: The appendix is within normal limits. The bladder is decompressed. The prostate is unremarkable. No obstructive change in the visualized bowel is noted. IMPRESSION: 1. Small perirectal abscess adjacent to the distal rectum to the left of the midline, measuring 2.0 x 1.9 x 2.3 cm, without extension to the skin surface. Electronically signed by: Oneil Devonshire MD 11/25/2024 03:10 AM EST RP Workstation: [id number]       Assessment/Plan Perirectal abscess - CT today with small 2 cm perirectal abscess adjacent to distal rectum left of midline without extension to skin surface - WBC 13.2, afebrile and HD stable - recommend OR for EUA and I&D.  I have discussed with him in detail the risks and benefits of the operation as well as some of the technical aspects including the possibility of not finding the abscess in which case we would abort and treat him with antibiotics.  He understands and wishes to proceed.   I reviewed ED provider notes, last 24 h vitals and pain scores, last 48 h intake and output, last 24 h labs and trends, and last 24 h imaging results.  This care required high  level of medical decision making.   Burnard JONELLE Louder, PA-C  [facility] 11/25/2024, 7:59 AM Please see Amion for pager number during day hours 7:00am-4:30pm      [1]  Allergies Allergen Reactions   Penicillins Rash    Unknown Has patient had a PCN reaction causing immediate rash, facial/tongue/throat swelling, SOB or lightheadedness with hypotension: No Has patient had a PCN reaction causing severe rash involving mucus membranes or skin necrosis: No Has patient had a PCN reaction that required hospitalization No Has patient had a PCN reaction occurring within the last 10 years: No If all of the above answers are NO, then may proceed with Cephalosporin use.    Tramadol  Nausea Only

## 2024-11-25 NOTE — ED Provider Notes (Signed)
 [place] EMERGENCY DEPARTMENT AT [facility] Provider Note   CSN: [id number] Arrival date & time: 11/25/24  9981     Patient presents with: Rectal Pain   Danny Camacho is a 33 y.o. male.   The history is provided by the patient.  Patient with history of hypertension, HIV presents for rectal pain and concern for rectal abscess. Patient reports the symptoms started around 2 days ago.  He reports difficulty having a bowel movement.  He reports fever and chills.  No vomiting.  No abdominal pain. No recent sexual activity. Patient has had this issue before and required incision and drainage. Patient reports his health conditions are well-controlled, his HIV is undetectable    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   HIV (human immunodeficiency virus infection) (HCC)    Hypertension    Sprain of lateral ligament of ankle joint 07/29/2021     Prior to Admission medications  Medication Sig Start Date End Date Taking? Authorizing Provider  dolutegravir  (TIVICAY ) 50 MG tablet Take 50 mg by mouth daily.    [provider]  emtricitabine -tenofovir  AF (DESCOVY ) 200-25 MG tablet Take 1 tablet by mouth daily.    [provider]  escitalopram  (LEXAPRO ) 10 MG tablet Take 10 mg by mouth daily. 06/10/20   [provider]  lisinopril  (PRINIVIL ,ZESTRIL ) 20 MG tablet Take 1 tablet (20 mg total) by mouth daily. Patient taking differently: Take 10 mg by mouth daily. 11/29/15   Veronica Almarie BROCKS, PA-C    Allergies: Penicillins and Tramadol     Review of Systems  Constitutional:  Positive for chills and fever.  Gastrointestinal:  Positive for rectal pain. Negative for vomiting.  Genitourinary:  Negative for dysuria.    Updated Vital Signs BP (!) 140/81   Pulse (!) 106   Temp 98.4 F (36.9 C) (Oral)   Resp 20   Ht 1.778 m (5' 10)   Wt 99.8 kg   SpO2 98%   BMI 31.57 kg/m   Physical  Exam CONSTITUTIONAL: Well developed/well nourished, uncomfortable appearing HEAD: Normocephalic/atraumatic ENMT: Mucous membranes moist NECK: supple no meningeal signs CV: S1/S2 noted, no murmurs/rubs/gallops noted LUNGS: Lungs are clear to auscultation bilaterally, no apparent distress ABDOMEN: soft, nontender, no rebound or guarding, bowel sounds noted throughout abdomen Rectal-diffuse perirectal tenderness but no perianal abscess noted.  There are no hemorrhoids.  No blood or melena.  Nurse Elouise present for exam No tenderness extending into the perineum, no erythema or induration into the perineum NEURO: Pt is awake/alert/appropriate, moves all extremitiesx4.  No facial droop.   EXTREMITIES: pulses normal/equal, full ROM SKIN: warm, color normal  (all labs ordered are listed, but only abnormal results are displayed) Labs Reviewed  CBC WITH DIFFERENTIAL/PLATELET - Abnormal; Notable for the following components:      Result Value   WBC 13.2 (*)    Neutro Abs 9.8 (*)    Monocytes Absolute 1.6 (*)    All other components within normal limits  BASIC METABOLIC PANEL WITH GFR - Abnormal; Notable for the following components:   Glucose, Bld 108 (*)    All other components within normal limits    EKG: None  Radiology: CT PELVIS W CONTRAST Result Date: 11/25/2024 EXAM: CT PELVIS, WITH IV CONTRAST 11/25/2024 02:52:51 AM TECHNIQUE: Axial images were acquired through the pelvis with IV contrast. Reformatted images were reviewed. Automated exposure control, iterative reconstruction, and/or weight based adjustment of the mA/kV was utilized to reduce  the radiation dose to as low as reasonably achievable. COMPARISON: 07/13/2022. CLINICAL HISTORY: Perianal abscess or fistula suspected. Perirectal pain, evaluate for possible abscess. FINDINGS: BONES: Bony structures are within normal limits. JOINTS: No dislocation. The joint spaces are normal. SOFT TISSUES: Adjacent to the distal rectum to the left  of the midline, there is a small 2.0 x 1.9 x 2.3 cm fluid collection. This is consistent with a small perirectal abscess. No extension to the skin surface is identified. No other fluid collection is seen. INTRAPELVIC CONTENTS: The appendix is within normal limits. The bladder is decompressed. The prostate is unremarkable. No obstructive change in the visualized bowel is noted. IMPRESSION: 1. Small perirectal abscess adjacent to the distal rectum to the left of the midline, measuring 2.0 x 1.9 x 2.3 cm, without extension to the skin surface. Electronically signed by: Oneil Devonshire MD 11/25/2024 03:10 AM EST RP Workstation: [id number]     Procedures   Medications Ordered in the ED  cefTRIAXone  (ROCEPHIN ) 2 g in sodium chloride  0.9 % 100 mL IVPB (0 g Intravenous Stopped 11/25/24 0338)    And  metroNIDAZOLE  (FLAGYL ) IVPB 500 mg (500 mg Intravenous New Bag/Given 11/25/24 0333)  fentaNYL  (SUBLIMAZE ) injection 100 mcg (has no administration in time range)  ondansetron  (ZOFRAN ) injection 4 mg (has no administration in time range)  sodium chloride  0.9 % bolus 1,000 mL (has no administration in time range)  fentaNYL  (SUBLIMAZE ) injection 100 mcg (100 mcg Intravenous Given 11/25/24 0236)  ondansetron  (ZOFRAN ) injection 4 mg (4 mg Intravenous Given 11/25/24 0237)  iohexol  (OMNIPAQUE ) 300 MG/ML solution 100 mL (100 mLs Intravenous Contrast Given 11/25/24 0248)    Clinical Course as of 11/25/24 0400  Tue Nov 25, 2024  0334 I discussed the case with Dr. Paola with general surgery She has reviewed imaging and labs.  She will coordinate transferring patient to the was a long operating suite for I&D later this morning. Will keep patient n.p.o. and treat pain  [DW]  0358 Patient updated on plan.  He reports feeling improved.  He was instructed to remain NPO.  Will treat pain as needed.  Will give IV fluids [DW]    Clinical Course User Index [DW] Midge Golas, MD                                 Medical  Decision Making Amount and/or Complexity of Data Reviewed Labs: ordered. Radiology: ordered.  Risk Prescription drug management.   This patient presents to the ED for concern of rectal pain, this involves an extensive number of treatment options, and is a complaint that carries with it a high risk of complications and morbidity.  The differential diagnosis includes but is not limited to thrombosed hemorrhoid, perirectal abscess, perianal abscess, cellulitis  Comorbidities that complicate the patient evaluation: Patients presentation is complicated by their history of HIV  Social Determinants of Health: Patients veteran status  increases the complexity of managing their presentation  Additional history obtained: Records reviewed previous admission documents  Lab Tests: I Ordered, and personally interpreted labs.  The pertinent results include: Leukocytosis  Imaging Studies ordered: I ordered imaging studies including CT scan pelvis  I independently visualized and interpreted imaging which showed peri-rectal abscess I agree with the radiologist interpretation  Medicines ordered and prescription drug management: I ordered medication including fentanyl  for pain Reevaluation of the patient after these medicines showed that the patient    improved  Critical  Interventions:   IV antibiotics, arranged operative management  Consultations Obtained: I requested consultation with the consultant general surgery, and discussed  findings as well as pertinent plan - they recommend: Will take patient to the OR later this morning  Reevaluation: After the interventions noted above, I reevaluated the patient and found that they have :improved  Complexity of problems addressed: Patients presentation is most consistent with  acute presentation with potential threat to life or bodily function  Disposition: After consideration of the diagnostic results and the patients response to treatment,   I feel that the patent would benefit from admission  .        Final diagnoses:  Perirectal abscess    ED Discharge Orders     None          Midge Golas, MD 11/25/24 0400

## 2024-11-25 NOTE — Anesthesia Procedure Notes (Addendum)
 Procedure Name: LMA Insertion Date/Time: 11/25/2024 1:16 PM  Performed by: Franchot Delon RAMAN, CRNAPre-anesthesia Checklist: Patient identified, Emergency Drugs available, Suction available and Patient being monitored Patient Re-evaluated:Patient Re-evaluated prior to induction Oxygen Delivery Method: Circle System Utilized Preoxygenation: Pre-oxygenation with 100% oxygen Induction Type: IV induction Ventilation: Mask ventilation without difficulty LMA: LMA inserted LMA Size: 5.0 Number of attempts: 1 Airway Equipment and Method: Bite block Placement Confirmation: positive ETCO2 Tube secured with: Tape Dental Injury: Teeth and Oropharynx as per pre-operative assessment  Difficulty Due To: Difficulty was anticipated Comments: Copy of emergence note explaining difficult airway designation:  Pt with laryngospasm. Positive pressure, propofol  given. Unable to break, Succ given, O2 sats 65%, Anesthesiologist notified. Able to deliver pos pressure breaths. O2 sats increased to 95%. Assisted ventilation for mask breaths for ~15 min, weaned O2 to face mask with 8L O2. OAW D/C'd. SELMER

## 2024-11-25 NOTE — ED Triage Notes (Signed)
 Pt states that he thinks that he has a rectal abscess. He states that he can feel it. Denies drainage, but reports pain in area.

## 2024-11-25 NOTE — Transfer of Care (Signed)
 Immediate Anesthesia Transfer of Care Note  Patient: Danny Camacho  Procedure(s) Performed: INCISION AND DRAINAGE, ABSCESS, PERIRECTAL (Rectum)  Patient Location: PACU  Anesthesia Type:General  Level of Consciousness: awake, alert , oriented, and patient cooperative  Airway & Oxygen Therapy: Patient Spontanous Breathing and Patient connected to face mask oxygen  Post-op Assessment: Report given to RN and Post -op Vital signs reviewed and stable  Post vital signs: Reviewed and stable  Last Vitals:  Vitals Value Taken Time  BP 157/101 11/25/24 14:15  Temp    Pulse 110 11/25/24 14:17  Resp 18 11/25/24 14:17  SpO2 100 % 11/25/24 14:17  Vitals shown include unfiled device data.  Last Pain:  Vitals:   11/25/24 1058  TempSrc:   PainSc: 10-Worst pain ever      Patients Stated Pain Goal: 2 (11/25/24 1058)  Complications: No notable events documented.

## 2024-11-26 ENCOUNTER — Other Ambulatory Visit (HOSPITAL_COMMUNITY): Payer: Self-pay

## 2024-11-26 ENCOUNTER — Encounter (HOSPITAL_COMMUNITY): Payer: Self-pay | Admitting: General Surgery

## 2024-11-26 LAB — BASIC METABOLIC PANEL WITH GFR
Anion gap: 11 (ref 5–15)
BUN: 10 mg/dL (ref 6–20)
CO2: 23 mmol/L (ref 22–32)
Calcium: 8.9 mg/dL (ref 8.9–10.3)
Chloride: 104 mmol/L (ref 98–111)
Creatinine, Ser: 0.89 mg/dL (ref 0.61–1.24)
GFR, Estimated: >60 mL/min (ref >60)
Glucose, Bld: 107 mg/dL — ABNORMAL HIGH (ref 70–99)
Potassium: 4.1 mmol/L (ref 3.5–5.1)
Sodium: 138 mmol/L (ref 135–145)

## 2024-11-26 LAB — CBC
HCT: 43.6 % (ref 39.0–52.0)
Hemoglobin: 14.7 g/dL (ref 13.0–17.0)
MCH: 29.9 pg (ref 26.0–34.0)
MCHC: 33.7 g/dL (ref 30.0–36.0)
MCV: 88.8 fL (ref 80.0–100.0)
Platelets: 248 K/uL (ref 150–400)
RBC: 4.91 MIL/uL (ref 4.22–5.81)
RDW: 12.6 % (ref 11.5–15.5)
WBC: 19.1 K/uL — ABNORMAL HIGH (ref 4.0–10.5)
nRBC: 0 % (ref 0.0–0.2)

## 2024-11-26 MED ORDER — ACETAMINOPHEN 500 MG PO TABS: 1000.0000 mg | ORAL_TABLET | Freq: Four times a day (QID) | ORAL | Status: AC | PRN

## 2024-11-26 MED FILL — Oxycodone HCl Tab 5 MG: 5.0000 mg | ORAL | 3 days supply | Qty: 15 | Fill #0 | Status: AC

## 2024-11-26 MED FILL — Amoxicillin & K Clavulanate Tab 875-125 MG: 1.0000 | ORAL | 4 days supply | Qty: 8 | Fill #0 | Status: AC

## 2024-11-26 NOTE — Progress Notes (Signed)
 Discharge medications delivered to patient at the bedside.

## 2024-11-26 NOTE — Discharge Summary (Signed)
 Patient ID: Danny Camacho [id number] 02-Jul-1991 33 y.o.  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 Reports difficulty with stooling and fever and chills. Denies nausea, vomiting, abdominal pain. Denies recent sexual activity. Patient has had this before and required I&D. PMH significant for HTN and HIV both of which are well controlled. Not on blood thinners. Allergy to PCNs and reported intolerance to tramadol .  He came to the emergency department where a CT scan showed a very small perirectal abscess fairly high on the rectum.   Procedures EUA with internal drainage of perirectal abscess  Hospital Course:  The patient was admitted post-op for abx and pain control.  He was feeling much better on POD 1.  He will be DC home on a total of 5 days of agumentin.  Given he has had more than one previous perirectal abscess we will have him follow up our colorectal surgeon for further treatment.  He was otherwise stable at this time for DC home.  Physical Exam: Gen: NAD Lungs: respiratory effort unlabored Rectal: no incision or wound  Allergies as of 11/26/2024       Reactions   Tramadol  Nausea Only        Medication List     STOP taking these medications    ciprofloxacin  500 MG tablet Commonly known as: Cipro    ibuprofen  200 MG tablet Commonly known as: ADVIL    methocarbamol  750 MG tablet Commonly known as:  ROBAXIN    metroNIDAZOLE  500 MG tablet Commonly known as: FLAGYL        TAKE these medications    acetaminophen  500 MG tablet Commonly known as: TYLENOL  Take 2 tablets (1,000 mg total) by mouth every 6 (six) hours as needed.   amoxicillin -clavulanate 875-125 MG tablet Commonly known as: AUGMENTIN  Take 1 tablet by mouth 2 (two) times daily for 4 days.   emtricitabine -tenofovir  AF 200-25 MG tablet Commonly known as: DESCOVY  Take 1 tablet by mouth daily.   escitalopram  10 MG tablet Commonly known as: LEXAPRO  Take 10 mg by mouth daily.   lisinopril  20 MG tablet Commonly known as: ZESTRIL  Take 1 tablet (20 mg total) by mouth daily.   oxyCODONE  5 MG immediate release tablet Commonly known as: Oxy IR/ROXICODONE  Take 1 tablet (5 mg total) by mouth every 4 (four) hours as needed (pain).   pantoprazole  20 MG tablet Commonly known as: PROTONIX  Take 20 mg by mouth daily before breakfast. What changed: Another medication with the same name was removed. Continue taking this medication, and follow the directions you see here.          Follow-up Information     Teresa Lonni HERO, MD Follow up in 1 month(s).   Specialties: General Surgery, Colon and Rectal Surgery Why: Office will call you with a follow up appointment, If you don't hear from the office, please call, Arrive 30 minutes prior to your appointment time, Please bring your insurance card and photo  ID Contact information: [address] [place] [address] [phone]                 Signed: Burnard Banter, [facility] 11/26/2024, 10:18 AM Please see Amion for pager number during day hours 7:00am-4:30pm, 7-11:30am on Weekends

## 2024-11-26 NOTE — Plan of Care (Signed)
°  Problem: Safety: Goal: Ability to remain free from injury will improve Outcome: Progressing   Problem: Pain Managment: Goal: General experience of comfort will improve and/or be controlled Outcome: Progressing   Problem: Elimination: Goal: Will not experience complications related to urinary retention Outcome: Progressing   Problem: Nutrition: Goal: Adequate nutrition will be maintained Outcome: Progressing   Problem: Activity: Goal: Risk for activity intolerance will decrease Outcome: Progressing

## 2024-11-26 NOTE — Anesthesia Postprocedure Evaluation (Signed)
 Anesthesia Post Note  Patient: Danny Camacho  Procedure(s) Performed: INCISION AND DRAINAGE, ABSCESS, PERIRECTAL (Rectum)     Patient location during evaluation: PACU Anesthesia Type: General Level of consciousness: awake and alert Pain management: pain level controlled Vital Signs Assessment: post-procedure vital signs reviewed and stable Respiratory status: spontaneous breathing, nonlabored ventilation, respiratory function stable and patient connected to nasal cannula oxygen Cardiovascular status: blood pressure returned to baseline and stable Postop Assessment: no apparent nausea or vomiting Anesthetic complications: no   No notable events documented.  Last Vitals:  Vitals:   11/26/24 0558 11/26/24 1003  BP: (!) 153/70 (!) 180/131  Pulse: 80 75  Resp: 17 18  Temp: 36.6 C 36.5 C  SpO2: 100% 100%    Last Pain:  Vitals:   11/26/24 1028  TempSrc:   PainSc: 6                  Yanelis Osika S

## 2024-11-27 LAB — AEROBIC/ANAEROBIC CULTURE W GRAM STAIN (SURGICAL/DEEP WOUND)
